# Patient Record
Sex: Male | Born: 1970 | Race: White | Hispanic: No | Marital: Single | Smoking: Current every day smoker
Health system: Southern US, Community
[De-identification: ages and names within clinical notes are randomized; demographics above are authoritative.]

## PROBLEM LIST (undated history)

## (undated) DIAGNOSIS — K7460 Unspecified cirrhosis of liver: Secondary | ICD-10-CM

## (undated) DIAGNOSIS — R160 Hepatomegaly, not elsewhere classified: Secondary | ICD-10-CM

## (undated) DIAGNOSIS — S4990XA Unspecified injury of shoulder and upper arm, unspecified arm, initial encounter: Secondary | ICD-10-CM

## (undated) DIAGNOSIS — F1020 Alcohol dependence, uncomplicated: Secondary | ICD-10-CM

## (undated) HISTORY — PX: WRIST SURGERY: SHX841

## (undated) HISTORY — PX: SHOULDER SURGERY: SHX246

---

## 2014-07-07 ENCOUNTER — Observation Stay (HOSPITAL_COMMUNITY)
Admission: EM | Admit: 2014-07-07 | Discharge: 2014-07-07 | Disposition: A | Discharge status: Home / Self Care | Payer: Medicaid Other | Attending: Internal Medicine | Admitting: Internal Medicine

## 2014-07-07 ENCOUNTER — Encounter (HOSPITAL_COMMUNITY): Payer: Self-pay | Admitting: Emergency Medicine

## 2014-07-07 ENCOUNTER — Emergency Department (HOSPITAL_COMMUNITY): Payer: Self-pay

## 2014-07-07 DIAGNOSIS — R4020 Unspecified coma: Secondary | ICD-10-CM | POA: Insufficient documentation

## 2014-07-07 DIAGNOSIS — R55 Syncope and collapse: Secondary | ICD-10-CM | POA: Diagnosis not present

## 2014-07-07 DIAGNOSIS — Z720 Tobacco use: Secondary | ICD-10-CM | POA: Diagnosis not present

## 2014-07-07 DIAGNOSIS — R0789 Other chest pain: Principal | ICD-10-CM | POA: Insufficient documentation

## 2014-07-07 DIAGNOSIS — R072 Precordial pain: Secondary | ICD-10-CM | POA: Diagnosis not present

## 2014-07-07 DIAGNOSIS — R404 Transient alteration of awareness: Secondary | ICD-10-CM

## 2014-07-07 DIAGNOSIS — F1010 Alcohol abuse, uncomplicated: Secondary | ICD-10-CM | POA: Diagnosis not present

## 2014-07-07 DIAGNOSIS — R079 Chest pain, unspecified: Secondary | ICD-10-CM | POA: Diagnosis not present

## 2014-07-07 DIAGNOSIS — F17210 Nicotine dependence, cigarettes, uncomplicated: Secondary | ICD-10-CM | POA: Insufficient documentation

## 2014-07-07 DIAGNOSIS — Z87891 Personal history of nicotine dependence: Secondary | ICD-10-CM

## 2014-07-07 HISTORY — DX: Unspecified injury of shoulder and upper arm, unspecified arm, initial encounter: S49.90XA

## 2014-07-07 LAB — CBG MONITORING, ED: GLUCOSE-CAPILLARY: 106 mg/dL — AB (ref 70–99)

## 2014-07-07 LAB — RAPID URINE DRUG SCREEN, HOSP PERFORMED
Amphetamines: NOT DETECTED
Barbiturates: NOT DETECTED
Benzodiazepines: POSITIVE — AB
Cocaine: NOT DETECTED
Opiates: NOT DETECTED
TETRAHYDROCANNABINOL: POSITIVE — AB

## 2014-07-07 LAB — BASIC METABOLIC PANEL
ANION GAP: 7 (ref 5–15)
BUN: 6 mg/dL (ref 6–23)
CO2: 26 mmol/L (ref 19–32)
CREATININE: 0.58 mg/dL (ref 0.50–1.35)
Calcium: 7.7 mg/dL — ABNORMAL LOW (ref 8.4–10.5)
Chloride: 106 mmol/L (ref 96–112)
GFR calc non Af Amer: >90 mL/min (ref >90)
GLUCOSE: 103 mg/dL — AB (ref 70–99)
Potassium: 3.5 mmol/L (ref 3.5–5.1)
Sodium: 139 mmol/L (ref 135–145)

## 2014-07-07 LAB — CBC WITH DIFFERENTIAL/PLATELET
BASOS ABS: 0 10*3/uL (ref 0.0–0.1)
BASOS PCT: 1 % (ref 0–1)
Eosinophils Absolute: 0.2 10*3/uL (ref 0.0–0.7)
Eosinophils Relative: 6 % — ABNORMAL HIGH (ref 0–5)
HCT: 36.9 % — ABNORMAL LOW (ref 39.0–52.0)
Hemoglobin: 13 g/dL (ref 13.0–17.0)
Lymphocytes Relative: 31 % (ref 12–46)
Lymphs Abs: 1.2 10*3/uL (ref 0.7–4.0)
MCH: 32.8 pg (ref 26.0–34.0)
MCHC: 35.2 g/dL (ref 30.0–36.0)
MCV: 93.2 fL (ref 78.0–100.0)
MONOS PCT: 13 % — AB (ref 3–12)
Monocytes Absolute: 0.5 10*3/uL (ref 0.1–1.0)
NEUTROS PCT: 49 % (ref 43–77)
Neutro Abs: 1.9 10*3/uL (ref 1.7–7.7)
Platelets: 103 10*3/uL — ABNORMAL LOW (ref 150–400)
RBC: 3.96 MIL/uL — ABNORMAL LOW (ref 4.22–5.81)
RDW: 14.1 % (ref 11.5–15.5)
WBC: 3.9 10*3/uL — ABNORMAL LOW (ref 4.0–10.5)

## 2014-07-07 LAB — TSH: TSH: 0.754 u[IU]/mL (ref 0.350–4.500)

## 2014-07-07 LAB — I-STAT TROPONIN, ED: TROPONIN I, POC: 0 ng/mL (ref 0.00–0.08)

## 2014-07-07 LAB — TROPONIN I

## 2014-07-07 MED ORDER — LORAZEPAM 2 MG/ML IJ SOLN
1.0000 mg | Freq: Once | INTRAMUSCULAR | Status: AC
  Administered 2014-07-07: 1 mg via INTRAVENOUS
  Filled 2014-07-07: qty 1

## 2014-07-07 MED ORDER — CHLORDIAZEPOXIDE HCL 5 MG PO CAPS: 10.0000 mg | ORAL_CAPSULE | Freq: Three times a day (TID) | ORAL | Status: DC

## 2014-07-07 MED ORDER — SODIUM CHLORIDE 0.9 % IV BOLUS (SEPSIS)
1000.0000 mL | Freq: Once | INTRAVENOUS | Status: AC
Start: 2014-07-07 — End: 2014-07-07
  Administered 2014-07-07: 1000 mL via INTRAVENOUS

## 2014-07-07 MED ORDER — ASPIRIN 81 MG PO CHEW: 81.0000 mg | CHEWABLE_TABLET | Freq: Every day | ORAL | Status: DC

## 2014-07-07 NOTE — ED Notes (Signed)
Report to gabe, rn.  Pt care transferred 

## 2014-07-07 NOTE — ED Notes (Signed)
Notified RN of CBG 106

## 2014-07-07 NOTE — ED Notes (Signed)
Pt here from work were he had a syncopal episode , EMT that works with pt could not feel a pulse and started compressions , pt was also foaming at mouth , upon ems arrival pt was awake but confused

## 2014-07-07 NOTE — Discharge Instructions (Signed)
You are advised not to drive till you are cleared by the cardiologist to do do

## 2014-07-07 NOTE — ED Provider Notes (Signed)
CSN: [id number]     Arrival date & time 07/07/14  1052 History   First MD Initiated Contact with Patient 07/07/14 1052     Chief Complaint  Patient presents with  . Chest Pain  . Loss of Consciousness     (Consider location/radiation/quality/duration/timing/severity/associated sxs/prior Treatment) HPI   This is a 44 year old male with no significant past medical history who comes in after having probable syncopal episode. Patient was at work so having chest pain that lasted for a few seconds was substernal pressure-like. He started feeling poorly, walked outside and started feeling lightheaded hot, had a syncopal episode. Bystanders saw him and evaluated him, they cannot feel a pulse, so started chest compressions. They didn't approximately 5-10 chest compressions, and then he woke up. EMS got there and his vital signs were unremarkable is a little bit confused, but able to speak with them.  On arrival here he is awake alert able to offer history,. He says that his mother is currently dying, and has been very upset about this. He feels that this is caused him to have these symptoms. He currently denies chest pain or shortness of breath.  Past Medical History  Diagnosis Date  . Shoulder injury    History reviewed. No pertinent past surgical history. History reviewed. No pertinent family history. History  Substance Use Topics  . Smoking status: Current Every Day Smoker  . Smokeless tobacco: Not on file  . Alcohol Use: 3.6 oz/week    6 Cans of beer per week    Review of Systems  Constitutional: Negative for fever and chills.  Eyes: Negative for redness.  Respiratory: Positive for shortness of breath. Negative for cough.   Cardiovascular: Positive for chest pain.  Gastrointestinal: Negative for nausea, vomiting, abdominal pain and diarrhea.  Genitourinary: Negative for dysuria.  Skin: Negative for rash.  Neurological: Positive for syncope. Negative for headaches.  All other  systems reviewed and are negative.     Allergies  Review of patient's allergies indicates no known allergies.  Home Medications   Prior to Admission medications   Not on File   BP 160/89 mmHg  Pulse 76  Temp(Src) 98 F (36.7 C) (Oral)  Resp 14  SpO2 98% Physical Exam  Constitutional: He is oriented to person, place, and time. No distress.  HENT:  Head: Normocephalic and atraumatic.  Eyes: EOM are normal. Pupils are equal, round, and reactive to light.  Neck: Normal range of motion. Neck supple.  Cardiovascular: Normal rate.   Pulmonary/Chest: Effort normal. No respiratory distress.  Abdominal: Soft. There is no tenderness.  Musculoskeletal: Normal range of motion.  Neurological: He is alert and oriented to person, place, and time.  Skin: No rash noted. He is not diaphoretic.  Psychiatric: He has a normal mood and affect.   Cranial Nerves  II:  Visual fields Intact, pupillary equal round and reactive to light III, IV, VI: Full eye movement without nystagmus  V Facial Sensation: Normal. No weakness of masticatory muscles  VII: No facial weakness or asymmetry  VIII Auditory Acuity: Grossly normal  IX/X: The uvula is midline; the palate elevates symmetrically  XI: Normal sternocleidomastoid and trapezius strength  XII: The tongue is midline. No atrophy or fasciculations.  Motor System: Muscle Strength: 5/5 and symmetric in the upper and lower extremities. No pronation or drift.  Muscle Tone: Tone and muscle bulk are normal in the upper and lower extremities.  Reflexes: DTRs: 2+ and symmetrical in all four extremities.  Coordination: Intact finger-to-nose,  heel-to-shin, and rapid alternating movements. No tremor.  Sensation: Intact to light touch.      ED Course  Procedures (including critical care time) Labs Review Labs Reviewed  CBC WITH DIFFERENTIAL/PLATELET  BASIC METABOLIC PANEL  CBG MONITORING, ED  I-STAT TROPOININ, ED    Imaging Review No results  found.   EKG Interpretation   Date/Time:  Thursday July 07 2014 10:58:05 EDT Ventricular Rate:  87 PR Interval:  167 QRS Duration: 97 QT Interval:  380 QTC Calculation: 457 R Axis:   96 Text Interpretation:  Sinus rhythm Borderline right axis deviation No  previous tracing Confirmed by BEATON  MD, ROBERT ([id number]) on 07/07/2014  11:02:26 AM      MDM   Final diagnoses:  None    This is a 44 year old male with no significant past medical history who comes in after having probable syncopal episode with concern for possible cardiac arrest, s/p chest compressions.  Exam as above WNL. VSS.  Feel this was most likely a syncopal episode given the prodromal sx and no hx of cardiac disease or comorbid conditions making him low risk for cardiac cause of syncope, however given the hx of possible arrest, some concern for possible arrhythmia.  The cardiac arrest was atypical in that it was very short with bystanders only given approx 5 compressions prior to ROSC.  EKG w/o abnormalities.  Will obtain, cbc/bmp/istat trop and admit for arrhythmia obs.  Labs unremarkable, trop neg.  Spoke with Rosann Auerbachrish cards who will consult in the ED and patient will be admitted to hosp service.  He has been stable in the ED.   Silas FloodErik Eliaz Fout, MD 07/07/14 1703  Nelva Nayobert Beaton, MD 07/16/14 [phone]

## 2014-07-07 NOTE — Discharge Summary (Signed)
                                                         AMA  Patient at this time expresses desire to leave the Hospital immidiately, patient has been warned that this is not Medically advisable at this time, and can result in Medical complications like Death and Disability, patient understands and accepts the risks involved and assumes full responsibilty of this decision. Told not to drive till cleared by Cards.   Blake Lambert,PRASHANT K M.D on 07/07/2014 at 6:28 PM  [facility]    Last Note Below

## 2014-07-07 NOTE — Consult Note (Signed)
CARDIOLOGY CONSULT NOTE   Patient ID: Blake Lambert MRN: [id number], DOB/AGE: February 06, 1971   Admit date: 07/07/2014 Date of Consult: 07/07/2014   Primary Physician: No PCP per Pt Primary Cardiologist: New  Pt. Profile  44 year old Caucasian male with no significant past medical history and no cardiac history presented with syncope and chest pain  Problem List  Past Medical History  Diagnosis Date  . Shoulder injury     Past Surgical History  Procedure Laterality Date  . Shoulder surgery      L shoulder     Allergies  No Known Allergies  HPI   The patient is a 44 year old Caucasian male with no significant past medical history and no cardiac history. Although patient does not have a PCP and has been several years since he was last seen by health provider. Not taking any medication at home. He denies any prior stress test or cardiac workup. He has been under a lot of pressure for the past month, as his mother was diagnosed with cancer. He does endorse occasional chest discomfort in the past month which he described is related to anxiety. He states the chest discomfort is not usually related to exertion but more so with the degree of anxiety he has for his mother's illness.   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 His work area was very hot, he started to feel dizzy and have diaphoresis. He went outside to the [place] to rest, however subsequently lost consciousness. He was found by bystanders/coworker, they did not appreciate a pulse, CPR was started. After only 5-10 compression, he woke up. [facility]. Significant laboratory finding include negative troponin 2, white blood cell count 3.9, hemoglobin 13, platelet 103. TSH normal. Chest x-ray is normal. EKG showed normal sinus rhythm, minimal ST changes in the inferior lead, no significant T-wave  changes. Patient was admitted to internal medicine service, cardiology has been consulted for syncope.  Inpatient Medications  . aspirin  81 mg Oral Daily  . chlordiazePOXIDE  10 mg Oral TID    Family History Family History  Problem Relation Age of Onset  . Cancer Mother     unkown type of CA     Social History History   Social History  . Marital Status: Single    Spouse Name: N/A  . Number of Children: N/A  . Years of Education: N/A   Occupational History  . Not on file.   Social History Main Topics  . Smoking status: Current Every Day Smoker  . Smokeless tobacco: Not on file     Comment: 1/2 ppd  . Alcohol Use: 3.6 oz/week    6 Cans of beer per week     Comment: 5-6 beers per night  . Drug Use: Yes     Comment: occasional marijuana  . Sexual Activity: Not on file   Other Topics Concern  . Not on file   Social History Narrative  . No narrative on file     Review of Systems  General:  No chills, fever, night sweats or weight changes.  Cardiovascular:  No dyspnea on exertion, edema, orthopnea, palpitations, paroxysmal nocturnal dyspnea. +chest pain, SOB, diaphoresis Dermatological: No rash, lesions/masses Respiratory: No cough Urologic: No hematuria, dysuria Abdominal:   No nausea, vomiting, diarrhea, bright red blood per rectum, melena, or hematemesis Neurologic:  No visual changes +wkns, changes in mental status. All other systems  reviewed and are otherwise negative except as noted above.  Physical Exam  Blood pressure 117/70, pulse 89, temperature 98 F (36.7 C), temperature source Oral, resp. rate 12, SpO2 92 %.  General: Pleasant, NAD Psych: Normal affect. Neuro: Alert and oriented X 3. Moves all extremities spontaneously. HEENT: Normal  Neck: Supple without bruits or JVD. Lungs:  Resp regular and unlabored, CTA. Heart: RRR no s3, s4, or murmurs. Chest sore after CPR. Abdomen: Soft, non-tender, non-distended, BS + x 4.  Extremities: No clubbing,  cyanosis or edema. DP/PT/Radials 2+ and equal bilaterally.  Labs   Recent Labs  07/07/14 1439  TROPONINI <0.03   Lab Results  Component Value Date   WBC 3.9* 07/07/2014   HGB 13.0 07/07/2014   HCT 36.9* 07/07/2014   MCV 93.2 07/07/2014   PLT 103* 07/07/2014     Recent Labs Lab 07/07/14 1218  NA 139  K 3.5  CL 106  CO2 26  BUN 6  CREATININE 0.58  CALCIUM 7.7*  GLUCOSE 103*   No results found for: CHOL, HDL, LDLCALC, TRIG No results found for: DDIMER  Radiology/Studies  Dg Chest Port 1 View  07/07/2014   CLINICAL DATA:  Initial evaluation for intermittent chest pain for 5 months  EXAM: PORTABLE CHEST - 1 VIEW  COMPARISON:  None.  FINDINGS: Heart size upper normal. Vascular pattern normal. Lungs clear. ORIF prior left clavicle fracture. No pleural effusion.  IMPRESSION: No active disease.   Electronically Signed   By: Esperanza Heir M.D.   On: 07/07/2014 12:09    ECG  NSR with minimal ST changes  ASSESSMENT AND PLAN  1. Syncope  - unclear etiology, maybe neurologically driven as patient has been extremely anxious regarding his mother's condition vs dehydration (urine appears to be concentrated however BUN to Cr ratio only 10) vs arrhythmia vs underlying CAD  - discussed with Dr. [place], potentially do treadmill stress echo to r/o ischemia and check EF, if negative, will consider outpatient heart monitor  - if echo has R heart strain, will need to r/o PE although suspicion low as he is fairly active  2. Chest pain  - per pt, chest pain related more to emotion in regard to his mother's recent health decline  - cardiac risk factor smoking. No known family h/o CAD, although pt does not know his father  - EKG showed minimal ST changes in inferior lead  3. Alcohol abuse 4. Tobacco abuse 5. Occasional marijuana use (last use 3 days ago)   Signed, Azalee Course, [place] 07/07/2014, 5:11 PM Patient seen and examined and history reviewed. Agree with above findings and  plan. 44 yo WM previously healthy. Chronic tobacco and Etoh abuse (7 beers/night). Recent extreme stress related to mother's cancer diagnosis. 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 States he was out for 10 minutes. He had some atypical chest pain. Ecg is normal. Exam is normal. Troponin normal x 2. I think his passing out is more related to dehydration and mental stress. Needs to abstain from Etoh and tobacco. Will arrange stress Echo in am. If normal could DC from our standpoint.   Peter [place], MDFACC 07/07/2014 5:56 PM

## 2014-07-07 NOTE — Progress Notes (Signed)
Pt informed of risk of leaving hospital per MD orders. Pt informed not to drive until syncopal episode is determined and he is at risk of death if he leads the hospital without being medically stable. Patient agreed and signed AMA papers.

## 2014-07-07 NOTE — ED Notes (Signed)
Gave co-worker/friend patient's personal belongings.

## 2014-07-07 NOTE — ED Notes (Signed)
Report attempted. Unable to take report at this time  

## 2014-07-07 NOTE — H&P (Signed)
Patient Demographics  Blake Lambert, is a 44 y.o. male  MRN: [id number]   DOB - 29-Jun-1970  Admit Date - 07/07/2014  Outpatient Primary MD for the patient is No primary care provider on file.   With History of -  Past Medical History  Diagnosis Date  . Shoulder injury       History reviewed. No pertinent past surgical history.  in for   Chief Complaint  Patient presents with  . Chest Pain  . Loss of Consciousness     HPI  Blake Lambert  is a 43 y.o. 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 There was no seizure-like activity, no stool or urinary incontinence, no tongue bite. He was not post ictal.   He was brought to the ER where his initial workup including blood work, troponin EKG and chest x-ray was unremarkable. I was called to admit the patient for chest pain along with syncope. He has been under some emotional stress due to the sickness of his mom.   Review of Systems  currently symptom-free  In addition to the HPI above  No Fever-chills, No Headache, No changes with Vision or hearing, No problems swallowing food or Liquids, No Chest pain, Cough or Shortness of Breath, No Abdominal pain, No Nausea or Vommitting, Bowel movements are regular, No Blood in stool or Urine, No dysuria, No new skin rashes or bruises, No new joints pains-aches,  No new weakness,  tingling, numbness in any extremity, No recent weight gain or loss, No polyuria, polydypsia or polyphagia, No significant Mental Stressors.  A full 10 point Review of Systems was done, except as stated above, all other Review of Systems were negative.   Social History History  Substance Use Topics  . Smoking status: Current Every Day Smoker  . Smokeless tobacco: Not on file  . Alcohol Use: 3.6 oz/week    6 Cans of beer per week      Family History No history of sudden cardiac death  Prior to Admission medications   Medication Sig Start Date End Date Taking? Authorizing Provider  Aspirin-Acetaminophen-Caffeine (GOODYS EXTRA STRENGTH PO) Take 1 Package by mouth daily as needed (pain, headache).   Yes Historical Provider, MD    No Known Allergies  Physical Exam  Vitals  Blood pressure 127/75, pulse 67, temperature 98 F (36.7 C), temperature source Oral, resp. rate 15, SpO2 97 %.   1. General middle aged male lying in bed in NAD,  2.  Falt affect   Not Suicidal or Homicidal, Awake Alert, Oriented X 3.  3. No F.N deficits, ALL C.Nerves Intact, Strength 5/5 all 4 extremities, Sensation intact all 4 extremities, Plantars down going.  4. Ears and Eyes appear Normal, Conjunctivae clear, PERRLA. Moist Oral Mucosa.  5. Supple Neck, No JVD, No cervical lymphadenopathy appriciated, No Carotid Bruits.  6. Symmetrical Chest wall movement, Good air movement bilaterally, CTAB.  7. RRR, No Gallops, Rubs or Murmurs, No Parasternal Heave.  8. Positive Bowel Sounds, Abdomen Soft, No tenderness, No organomegaly appriciated,No rebound -guarding or rigidity.  9.  No Cyanosis, Normal Skin Turgor, No Skin Rash or Bruise.  10. Good muscle tone,  joints appear normal , no effusions, Normal ROM.  11. No Palpable Lymph Nodes in Neck or Axillae     Data Review  CBC  Recent Labs Lab 07/07/14 1218  WBC 3.9*  HGB 13.0  HCT 36.9*  PLT 103*  MCV 93.2  MCH 32.8  MCHC 35.2    RDW 14.1  LYMPHSABS 1.2  MONOABS 0.5  EOSABS 0.2  BASOSABS 0.0   ------------------------------------------------------------------------------------------------------------------  Chemistries   Recent Labs Lab 07/07/14 1218  NA 139  K 3.5  CL 106  CO2 26  GLUCOSE 103*  BUN 6  CREATININE 0.58  CALCIUM 7.7*   ------------------------------------------------------------------------------------------------------------------ CrCl cannot be calculated (Unknown ideal weight.). ------------------------------------------------------------------------------------------------------------------ No results for input(s): TSH, T4TOTAL, T3FREE, THYROIDAB in the last 72 hours.  Invalid input(s): FREET3   Coagulation profile No results for input(s): INR, PROTIME in the last 168 hours. ------------------------------------------------------------------------------------------------------------------- No results for input(s): DDIMER in the last 72 hours. -------------------------------------------------------------------------------------------------------------------  Cardiac Enzymes No results for input(s): CKMB, TROPONINI, MYOGLOBIN in the last 168 hours.  Invalid input(s): CK ------------------------------------------------------------------------------------------------------------------ Invalid input(s): POCBNP   ---------------------------------------------------------------------------------------------------------------  Urinalysis No results found for: COLORURINE, APPEARANCEUR, LABSPEC, PHURINE, GLUCOSEU, HGBUR, BILIRUBINUR, KETONESUR, PROTEINUR, UROBILINOGEN, NITRITE, LEUKOCYTESUR  ----------------------------------------------------------------------------------------------------------------  Imaging results:   Dg Chest Port 1 View  07/07/2014   CLINICAL DATA:  Initial evaluation for intermittent chest pain for 5 months  EXAM: PORTABLE CHEST - 1 VIEW  COMPARISON:   None.  FINDINGS: Heart size upper normal. Vascular pattern normal. Lungs clear. ORIF prior left clavicle fracture. No pleural effusion.  IMPRESSION: No active disease.   Electronically Signed   By: Esperanza Heir M.D.   On: 07/07/2014 12:09    My personal review of EKG: Rhythm NSR, Rate  87 /min, QTc 457 ms , no Acute ST changes    Assessment & Plan   1. Syncope with Chest pain -  risk factor is cigarette smoking, no other known medical problems but he does not follow with PCP either, will be kept and 23 are observation, telemetry, cycle troponin, place on 81 of aspirin, check TSH and echogram. Request cardiology to evaluate, question need for outpatient event monitor worse is a stress test.    2. Alcohol abuse and ongoing smoking. Counseled to quit. Schedule Librium along with CIWA protocol.     DVT Prophylaxis  Lovenox   AM Labs Ordered, also please review Full Orders  Family Communication: Admission, patients condition and plan of care including tests being ordered have been discussed with the patient and family who indicate understanding and agree with the plan and Code Status.  Code Status Full  Likely DC to  Home  Condition Fair  Time spent in minutes : 35    Susa Raring K M.D on 07/07/2014 at 2:25 PM  Between 7am to 7pm - Pager -  [phone]  After 7pm go to [email or web address] - password [email or web address]  [facility]  Office  [phone]

## 2014-09-07 ENCOUNTER — Emergency Department (HOSPITAL_COMMUNITY): Payer: Medicaid Other

## 2014-09-07 ENCOUNTER — Inpatient Hospital Stay (HOSPITAL_COMMUNITY)
Admission: EM | Admit: 2014-09-07 | Discharge: 2014-09-13 | DRG: 420 | Disposition: A | Discharge status: Home / Self Care | Payer: Medicaid Other | Attending: Internal Medicine | Admitting: Internal Medicine

## 2014-09-07 ENCOUNTER — Encounter (HOSPITAL_COMMUNITY): Payer: Self-pay | Admitting: Family Medicine

## 2014-09-07 ENCOUNTER — Inpatient Hospital Stay (HOSPITAL_COMMUNITY): Payer: Medicaid Other

## 2014-09-07 DIAGNOSIS — F1010 Alcohol abuse, uncomplicated: Secondary | ICD-10-CM | POA: Diagnosis not present

## 2014-09-07 DIAGNOSIS — K7040 Alcoholic hepatic failure without coma: Secondary | ICD-10-CM | POA: Diagnosis present

## 2014-09-07 DIAGNOSIS — E871 Hypo-osmolality and hyponatremia: Secondary | ICD-10-CM | POA: Diagnosis present

## 2014-09-07 DIAGNOSIS — D6959 Other secondary thrombocytopenia: Secondary | ICD-10-CM | POA: Diagnosis present

## 2014-09-07 DIAGNOSIS — K7031 Alcoholic cirrhosis of liver with ascites: Principal | ICD-10-CM | POA: Diagnosis present

## 2014-09-07 DIAGNOSIS — R1110 Vomiting, unspecified: Secondary | ICD-10-CM

## 2014-09-07 DIAGNOSIS — R34 Anuria and oliguria: Secondary | ICD-10-CM | POA: Diagnosis present

## 2014-09-07 DIAGNOSIS — F10239 Alcohol dependence with withdrawal, unspecified: Secondary | ICD-10-CM | POA: Diagnosis present

## 2014-09-07 DIAGNOSIS — R7401 Elevation of levels of liver transaminase levels: Secondary | ICD-10-CM

## 2014-09-07 DIAGNOSIS — R188 Other ascites: Secondary | ICD-10-CM | POA: Diagnosis not present

## 2014-09-07 DIAGNOSIS — R6510 Systemic inflammatory response syndrome (SIRS) of non-infectious origin without acute organ dysfunction: Secondary | ICD-10-CM | POA: Insufficient documentation

## 2014-09-07 DIAGNOSIS — K766 Portal hypertension: Secondary | ICD-10-CM | POA: Diagnosis present

## 2014-09-07 DIAGNOSIS — R740 Nonspecific elevation of levels of transaminase and lactic acid dehydrogenase [LDH]: Secondary | ICD-10-CM | POA: Diagnosis present

## 2014-09-07 DIAGNOSIS — D696 Thrombocytopenia, unspecified: Secondary | ICD-10-CM | POA: Diagnosis not present

## 2014-09-07 DIAGNOSIS — E876 Hypokalemia: Secondary | ICD-10-CM | POA: Diagnosis present

## 2014-09-07 DIAGNOSIS — Z7982 Long term (current) use of aspirin: Secondary | ICD-10-CM | POA: Diagnosis not present

## 2014-09-07 DIAGNOSIS — F17210 Nicotine dependence, cigarettes, uncomplicated: Secondary | ICD-10-CM | POA: Diagnosis present

## 2014-09-07 DIAGNOSIS — R1011 Right upper quadrant pain: Secondary | ICD-10-CM | POA: Diagnosis not present

## 2014-09-07 DIAGNOSIS — R195 Other fecal abnormalities: Secondary | ICD-10-CM | POA: Diagnosis not present

## 2014-09-07 DIAGNOSIS — Z87891 Personal history of nicotine dependence: Secondary | ICD-10-CM

## 2014-09-07 DIAGNOSIS — R1010 Upper abdominal pain, unspecified: Secondary | ICD-10-CM

## 2014-09-07 DIAGNOSIS — G9001 Carotid sinus syncope: Secondary | ICD-10-CM

## 2014-09-07 DIAGNOSIS — R109 Unspecified abdominal pain: Secondary | ICD-10-CM

## 2014-09-07 DIAGNOSIS — F419 Anxiety disorder, unspecified: Secondary | ICD-10-CM | POA: Diagnosis present

## 2014-09-07 DIAGNOSIS — F10230 Alcohol dependence with withdrawal, uncomplicated: Secondary | ICD-10-CM | POA: Diagnosis not present

## 2014-09-07 DIAGNOSIS — R1901 Right upper quadrant abdominal swelling, mass and lump: Secondary | ICD-10-CM

## 2014-09-07 DIAGNOSIS — K852 Alcohol induced acute pancreatitis without necrosis or infection: Secondary | ICD-10-CM | POA: Insufficient documentation

## 2014-09-07 DIAGNOSIS — K7010 Alcoholic hepatitis without ascites: Secondary | ICD-10-CM | POA: Diagnosis not present

## 2014-09-07 DIAGNOSIS — R4020 Unspecified coma: Secondary | ICD-10-CM

## 2014-09-07 DIAGNOSIS — K7030 Alcoholic cirrhosis of liver without ascites: Secondary | ICD-10-CM | POA: Diagnosis not present

## 2014-09-07 DIAGNOSIS — B181 Chronic viral hepatitis B without delta-agent: Secondary | ICD-10-CM | POA: Diagnosis present

## 2014-09-07 DIAGNOSIS — R0782 Intercostal pain: Secondary | ICD-10-CM

## 2014-09-07 DIAGNOSIS — R1084 Generalized abdominal pain: Secondary | ICD-10-CM

## 2014-09-07 DIAGNOSIS — R160 Hepatomegaly, not elsewhere classified: Secondary | ICD-10-CM

## 2014-09-07 DIAGNOSIS — K7200 Acute and subacute hepatic failure without coma: Secondary | ICD-10-CM | POA: Diagnosis not present

## 2014-09-07 DIAGNOSIS — K810 Acute cholecystitis: Secondary | ICD-10-CM | POA: Diagnosis present

## 2014-09-07 DIAGNOSIS — K819 Cholecystitis, unspecified: Secondary | ICD-10-CM | POA: Diagnosis not present

## 2014-09-07 DIAGNOSIS — A419 Sepsis, unspecified organism: Secondary | ICD-10-CM | POA: Diagnosis not present

## 2014-09-07 HISTORY — DX: Hepatomegaly, not elsewhere classified: R16.0

## 2014-09-07 HISTORY — DX: Unspecified cirrhosis of liver: K74.60

## 2014-09-07 HISTORY — DX: Alcohol dependence, uncomplicated: F10.20

## 2014-09-07 LAB — HEPATIC FUNCTION PANEL
ALK PHOS: 217 U/L — AB (ref 38–126)
ALT: 103 U/L — AB (ref 17–63)
AST: 200 U/L — ABNORMAL HIGH (ref 15–41)
Albumin: 2.9 g/dL — ABNORMAL LOW (ref 3.5–5.0)
BILIRUBIN DIRECT: 1.2 mg/dL — AB (ref 0.1–0.5)
Indirect Bilirubin: 2 mg/dL — ABNORMAL HIGH (ref 0.3–0.9)
Total Bilirubin: 3.2 mg/dL — ABNORMAL HIGH (ref 0.3–1.2)
Total Protein: 7 g/dL (ref 6.5–8.1)

## 2014-09-07 LAB — URINALYSIS, ROUTINE W REFLEX MICROSCOPIC
GLUCOSE, UA: NEGATIVE mg/dL
Hgb urine dipstick: NEGATIVE
Ketones, ur: 15 mg/dL — AB
NITRITE: POSITIVE — AB
PH: 7.5 (ref 5.0–8.0)
Protein, ur: 30 mg/dL — AB
SPECIFIC GRAVITY, URINE: 1.028 (ref 1.005–1.030)
Urobilinogen, UA: 4 mg/dL — ABNORMAL HIGH (ref 0.0–1.0)

## 2014-09-07 LAB — CBC WITH DIFFERENTIAL/PLATELET
Basophils Absolute: 0 10*3/uL (ref 0.0–0.1)
Basophils Relative: 0 % (ref 0–1)
EOS PCT: 0 % (ref 0–5)
Eosinophils Absolute: 0 10*3/uL (ref 0.0–0.7)
HCT: 36.6 % — ABNORMAL LOW (ref 39.0–52.0)
Hemoglobin: 13.3 g/dL (ref 13.0–17.0)
LYMPHS ABS: 1.3 10*3/uL (ref 0.7–4.0)
LYMPHS PCT: 18 % (ref 12–46)
MCH: 32.3 pg (ref 26.0–34.0)
MCHC: 36.3 g/dL — ABNORMAL HIGH (ref 30.0–36.0)
MCV: 88.8 fL (ref 78.0–100.0)
Monocytes Absolute: 1.2 10*3/uL — ABNORMAL HIGH (ref 0.1–1.0)
Monocytes Relative: 18 % — ABNORMAL HIGH (ref 3–12)
Neutro Abs: 4.6 10*3/uL (ref 1.7–7.7)
Neutrophils Relative %: 64 % (ref 43–77)
Platelets: 91 10*3/uL — ABNORMAL LOW (ref 150–400)
RBC: 4.12 MIL/uL — ABNORMAL LOW (ref 4.22–5.81)
RDW: 14.9 % (ref 11.5–15.5)
WBC: 7.1 10*3/uL (ref 4.0–10.5)

## 2014-09-07 LAB — AMMONIA: AMMONIA: 31 umol/L (ref 9–35)

## 2014-09-07 LAB — PROTIME-INR
INR: 1.35 (ref 0.00–1.49)
Prothrombin Time: 16.8 seconds — ABNORMAL HIGH (ref 11.6–15.2)

## 2014-09-07 LAB — I-STAT CHEM 8, ED
BUN: 14 mg/dL (ref 6–20)
CALCIUM ION: 0.95 mmol/L — AB (ref 1.12–1.23)
CHLORIDE: 91 mmol/L — AB (ref 101–111)
Creatinine, Ser: 0.6 mg/dL — ABNORMAL LOW (ref 0.61–1.24)
Glucose, Bld: 137 mg/dL — ABNORMAL HIGH (ref 65–99)
HEMATOCRIT: 43 % (ref 39.0–52.0)
Hemoglobin: 14.6 g/dL (ref 13.0–17.0)
Potassium: 4.3 mmol/L (ref 3.5–5.1)
SODIUM: 132 mmol/L — AB (ref 135–145)
TCO2: 23 mmol/L (ref 0–100)

## 2014-09-07 LAB — I-STAT CG4 LACTIC ACID, ED
LACTIC ACID, VENOUS: 1.54 mmol/L (ref 0.5–2.0)
Lactic Acid, Venous: 5.34 mmol/L (ref 0.5–2.0)

## 2014-09-07 LAB — URINE MICROSCOPIC-ADD ON

## 2014-09-07 LAB — LACTIC ACID, PLASMA
LACTIC ACID, VENOUS: 1.7 mmol/L (ref 0.5–2.0)
Lactic Acid, Venous: 1.8 mmol/L (ref 0.5–2.0)

## 2014-09-07 LAB — AMYLASE: Amylase: 139 U/L — ABNORMAL HIGH (ref 28–100)

## 2014-09-07 LAB — POC OCCULT BLOOD, ED: Fecal Occult Bld: POSITIVE — AB

## 2014-09-07 LAB — LACTATE DEHYDROGENASE: LDH: 240 U/L — AB (ref 98–192)

## 2014-09-07 LAB — ACETAMINOPHEN LEVEL

## 2014-09-07 LAB — LIPASE, BLOOD: Lipase: 182 U/L — ABNORMAL HIGH (ref 22–51)

## 2014-09-07 MED ORDER — LORAZEPAM 2 MG/ML IJ SOLN: 0.0000 mg | Freq: Two times a day (BID) | INTRAMUSCULAR | Status: AC

## 2014-09-07 MED ORDER — ONDANSETRON HCL 4 MG/2ML IJ SOLN
4.0000 mg | Freq: Once | INTRAMUSCULAR | Status: AC
  Administered 2014-09-07: 4 mg via INTRAVENOUS
  Filled 2014-09-07: qty 2

## 2014-09-07 MED ORDER — LORAZEPAM 1 MG PO TABS
0.0000 mg | ORAL_TABLET | Freq: Two times a day (BID) | ORAL | Status: DC
Start: 2014-09-07 — End: 2014-09-08

## 2014-09-07 MED ORDER — SODIUM CHLORIDE 0.9 % IV SOLN
1.0000 g | Freq: Once | INTRAVENOUS | Status: AC
  Administered 2014-09-07: 1 g via INTRAVENOUS
  Filled 2014-09-07 (×2): qty 10

## 2014-09-07 MED ORDER — IOHEXOL 300 MG/ML  SOLN
100.0000 mL | Freq: Once | INTRAMUSCULAR | Status: AC | PRN
  Administered 2014-09-07: 100 mL via INTRAVENOUS

## 2014-09-07 MED ORDER — ONDANSETRON HCL 4 MG/2ML IJ SOLN: 4.0000 mg | Freq: Once | INTRAMUSCULAR | Status: AC

## 2014-09-07 MED ORDER — MORPHINE SULFATE 2 MG/ML IJ SOLN
2.0000 mg | INTRAMUSCULAR | Status: DC | PRN
  Administered 2014-09-07 – 2014-09-08 (×4): 4 mg via INTRAVENOUS
  Administered 2014-09-11 (×3): 2 mg via INTRAVENOUS
  Administered 2014-09-11 – 2014-09-12 (×5): 4 mg via INTRAVENOUS
  Administered 2014-09-12 (×2): 2 mg via INTRAVENOUS
  Administered 2014-09-13 (×3): 4 mg via INTRAVENOUS
  Filled 2014-09-07 (×3): qty 2
  Filled 2014-09-07 (×3): qty 1
  Filled 2014-09-07 (×4): qty 2
  Filled 2014-09-07: qty 1
  Filled 2014-09-07 (×7): qty 2

## 2014-09-07 MED ORDER — ONDANSETRON HCL 4 MG/2ML IJ SOLN
4.0000 mg | Freq: Three times a day (TID) | INTRAMUSCULAR | Status: DC | PRN
  Administered 2014-09-07 – 2014-09-10 (×3): 4 mg via INTRAVENOUS
  Administered 2014-09-11: 8 mg via INTRAVENOUS
  Filled 2014-09-07 (×3): qty 2
  Filled 2014-09-07: qty 4

## 2014-09-07 MED ORDER — PANTOPRAZOLE SODIUM 40 MG IV SOLR
40.0000 mg | Freq: Once | INTRAVENOUS | Status: AC
  Administered 2014-09-07: 40 mg via INTRAVENOUS
  Filled 2014-09-07: qty 40

## 2014-09-07 MED ORDER — DEXTROSE 5 % IV SOLN
1.0000 g | INTRAVENOUS | Status: DC
  Filled 2014-09-07: qty 10

## 2014-09-07 MED ORDER — DEXTROSE-NACL 5-0.9 % IV SOLN
INTRAVENOUS | Status: DC
  Administered 2014-09-07: 21:00:00 via INTRAVENOUS
  Administered 2014-09-08: 75 mL/h via INTRAVENOUS

## 2014-09-07 MED ORDER — VITAMIN B-1 100 MG PO TABS
100.0000 mg | ORAL_TABLET | Freq: Every day | ORAL | Status: DC
  Administered 2014-09-08 – 2014-09-13 (×4): 100 mg via ORAL
  Filled 2014-09-07 (×6): qty 1

## 2014-09-07 MED ORDER — LORAZEPAM 2 MG/ML IJ SOLN
0.0000 mg | Freq: Four times a day (QID) | INTRAMUSCULAR | Status: AC
  Administered 2014-09-08: 4 mg via INTRAVENOUS
  Administered 2014-09-08: 2 mg via INTRAVENOUS
  Filled 2014-09-07: qty 1
  Filled 2014-09-07: qty 2

## 2014-09-07 MED ORDER — DEXTROSE 5 % IV SOLN
1.0000 g | Freq: Once | INTRAVENOUS | Status: AC
  Administered 2014-09-07: 1 g via INTRAVENOUS
  Filled 2014-09-07: qty 10

## 2014-09-07 MED ORDER — SODIUM CHLORIDE 0.9 % IV SOLN: 250.0000 mL | INTRAVENOUS | Status: DC | PRN

## 2014-09-07 MED ORDER — FENTANYL CITRATE (PF) 100 MCG/2ML IJ SOLN
100.0000 ug | INTRAMUSCULAR | Status: DC | PRN
  Administered 2014-09-07 (×3): 100 ug via INTRAVENOUS
  Filled 2014-09-07 (×3): qty 2

## 2014-09-07 MED ORDER — LORAZEPAM 2 MG/ML IJ SOLN
1.0000 mg | Freq: Once | INTRAMUSCULAR | Status: AC
  Administered 2014-09-07: 1 mg via INTRAVENOUS
  Filled 2014-09-07: qty 1

## 2014-09-07 MED ORDER — FENTANYL CITRATE (PF) 100 MCG/2ML IJ SOLN
100.0000 ug | Freq: Once | INTRAMUSCULAR | Status: AC
  Administered 2014-09-07: 100 ug via INTRAVENOUS
  Filled 2014-09-07: qty 2

## 2014-09-07 MED ORDER — HYDROMORPHONE HCL 1 MG/ML IJ SOLN
1.0000 mg | Freq: Once | INTRAMUSCULAR | Status: AC
  Administered 2014-09-07: 1 mg via INTRAVENOUS
  Filled 2014-09-07: qty 1

## 2014-09-07 MED ORDER — LORAZEPAM 1 MG PO TABS
0.0000 mg | ORAL_TABLET | Freq: Four times a day (QID) | ORAL | Status: DC
  Administered 2014-09-07: 1 mg via ORAL
  Filled 2014-09-07: qty 1

## 2014-09-07 MED ORDER — SODIUM CHLORIDE 0.9 % IV BOLUS (SEPSIS)
1000.0000 mL | Freq: Once | INTRAVENOUS | Status: AC
  Administered 2014-09-07: 1000 mL via INTRAVENOUS

## 2014-09-07 MED ORDER — THIAMINE HCL 100 MG/ML IJ SOLN
100.0000 mg | Freq: Every day | INTRAMUSCULAR | Status: DC
  Administered 2014-09-07 – 2014-09-13 (×6): 100 mg via INTRAVENOUS
  Filled 2014-09-07 (×4): qty 1
  Filled 2014-09-07: qty 2
  Filled 2014-09-07 (×2): qty 1

## 2014-09-07 MED ORDER — FOLIC ACID 1 MG PO TABS
1.0000 mg | ORAL_TABLET | Freq: Every day | ORAL | Status: DC
  Administered 2014-09-07 – 2014-09-13 (×6): 1 mg via ORAL
  Filled 2014-09-07 (×7): qty 1

## 2014-09-07 MED ORDER — IOHEXOL 300 MG/ML  SOLN
25.0000 mL | Freq: Once | INTRAMUSCULAR | Status: AC | PRN
  Administered 2014-09-07: 25 mL via ORAL

## 2014-09-07 NOTE — ED Notes (Signed)
Co-Worker- [place] Sizerhomas Herdnandez- Can Pick Pt up when Discharged- [phone].

## 2014-09-07 NOTE — ED Notes (Signed)
Patient refused to put on non-slip socks and refused to put on shoes to ambulate to the restroom.

## 2014-09-07 NOTE — ED Notes (Signed)
Pt vomiting. Given nausea medication.

## 2014-09-07 NOTE — Progress Notes (Signed)
eLink Physician-Brief Progress Note Patient Name: Ina HomesJesse Fischbach DOB: 1970-05-27 MRN: [id number]   Date of Service  09/07/2014  HPI/Events of Note  abd pain - fent ineffective vomiting  eICU Interventions  Change to morphine prn zofran prn     Intervention Category Intermediate Interventions: Abdominal pain - evaluation and management  ALVA,RAKESH V. 09/07/2014, 11:26 PM

## 2014-09-07 NOTE — ED Notes (Signed)
Blake Lambert (co worker)  [phone]  **let pt know this is his number**

## 2014-09-07 NOTE — Progress Notes (Signed)
Pt states his pain is so bad he feels that he's going to start convulsing.  He's moaing/groaning and moving all over in the bed.  Dilaudid was given in the ED which never eased the pain.  Fentanyl was then given, pt received 3 doses of 100 mg over the course of 5 hours, the fentanyl didn't touch the pain either.  Pt just received 4mg  or morphine, RN will continue to monitor.

## 2014-09-07 NOTE — ED Notes (Signed)
Pa made aware patient Blake Lambert advised patient meets Septic criteria

## 2014-09-07 NOTE — Progress Notes (Signed)
Pt arrived to 2C15 at 2120. Vital signs stable, 10/10 abdominal pain, RN will continue to monitor.

## 2014-09-07 NOTE — ED Provider Notes (Signed)
CSN: [id number]     Arrival date & time 09/07/14  1500 History   First MD Initiated Contact with Patient 09/07/14 1514     Chief Complaint  Patient presents with  . Abdominal Pain  . Emesis     (Consider location/radiation/quality/duration/timing/severity/associated sxs/prior Treatment) HPI    44 year old  Male  With history of alcohol abuse presents for evaluation of abdominal pain. Patient states for the past 3 weeks he has had persistent upper quadrant abdominal pain, nausea and vomiting with flulike symptoms. States he was having difficulty keeping his food down. He admits to drinking about 12 beers per day and today he developed worsening sharp stabbing pain to his upper abdomen , and state " feels like something is ruptured ". Patient is currently 10 out of 10. he also noticed black tarry stool. Vomitus is nonbloody nonbilious. He denies fever, chills ,  Runny nose sneezing coughing , chest pain, shortness of breath , productive cough, hemoptysis, hematemesis , dysuria , hematuria. Denies lightheadedness or dizziness. Admits to smoking marijuana but denies any other recreational drug use. Denies prior history of pancreatitis or hepatitis. Denies history of IV drug use.  Past Medical History  Diagnosis Date  . Shoulder injury    Past Surgical History  Procedure Laterality Date  . Shoulder surgery      L shoulder   Family History  Problem Relation Age of Onset  . Cancer Mother     unkown type of CA   History  Substance Use Topics  . Smoking status: Current Every Day Smoker  . Smokeless tobacco: Not on file     Comment: 1/2 ppd  . Alcohol Use: 3.6 oz/week    6 Cans of beer per week     Comment: 5-6 beers per night    Review of Systems  All other systems reviewed and are negative.     Allergies  Review of patient's allergies indicates no known allergies.  Home Medications   Prior to Admission medications   Medication Sig Start Date End Date Taking? Authorizing  Provider  Aspirin-Acetaminophen-Caffeine (GOODYS EXTRA STRENGTH PO) Take 1 Package by mouth daily as needed (pain, headache).    Historical Provider, MD   BP 146/69 mmHg  Pulse 96  Temp(Src) 98.5 F (36.9 C) (Oral)  Resp 16  SpO2 100% Physical Exam  Constitutional: He appears well-developed and well-nourished. He appears distressed (Patient appears Very uncomfortable, squirming around in bed.).  HENT:  Mouth/Throat: Oropharynx is clear and moist.  Eyes: Conjunctivae and EOM are normal. Pupils are equal, round, and reactive to light.  Neck: Normal range of motion. Neck supple.  Cardiovascular:  Tachycardia without murmurs or gallops  Pulmonary/Chest:  Mildly tachypneic without active wheezes, rales, rhonchi  Abdominal: Soft. Bowel sounds are normal. He exhibits no distension. There is tenderness (Diffuse abdominal tenderness without focal point tenderness. No peritoneal sign.).  Neurological: He is alert.  Skin:  Skin is flushed.  Nursing note and vitals reviewed.   ED Course  Procedures (including critical care time)  3:37 PM Pt with  Hx of alcohol abuse, here with upper abd pain, persistent vomiting and now having black stool.  Stool with normal appearance on exam, hemoccult positive.  Pt however appears very uncomfortable, tachycardic due to his discomfort.   Will obtain acute abd series to r/o free air.  Pain medication, and antinausea medication along with ativan given for comfort.  IVF given.  Will monitor closely.  Care discussed with DR. Docherty.  5:12 PM UA with nitrite positive.  Pt does have abd pain but denies dysuria.  Urine culture sent.  Will start rocephin.  Lipase is 182, concerning for pancreatitis.  Elevated Total Bili along with transaminitis.  Pt currently awaits abd/pelvis CT scan.  Pt will need to be admitted for alcohol induce pancreatitis.  Care discussed with oncoming provider who will call for admission.     Labs Review Labs Reviewed  CBC WITH  DIFFERENTIAL/PLATELET - Abnormal; Notable for the following:    RBC 4.12 (*)    HCT 36.6 (*)    MCHC 36.3 (*)    Platelets 91 (*)    Monocytes Relative 18 (*)    Monocytes Absolute 1.2 (*)    All other components within normal limits  URINALYSIS, ROUTINE W REFLEX MICROSCOPIC - Abnormal; Notable for the following:    Color, Urine RED (*)    APPearance CLOUDY (*)    Bilirubin Urine MODERATE (*)    Ketones, ur 15 (*)    Protein, ur 30 (*)    Urobilinogen, UA 4.0 (*)    Nitrite POSITIVE (*)    Leukocytes, UA SMALL (*)    All other components within normal limits  LIPASE, BLOOD - Abnormal; Notable for the following:    Lipase 182 (*)    All other components within normal limits  HEPATIC FUNCTION PANEL - Abnormal; Notable for the following:    Albumin 2.9 (*)    AST 200 (*)    ALT 103 (*)    Alkaline Phosphatase 217 (*)    Total Bilirubin 3.2 (*)    Bilirubin, Direct 1.2 (*)    Indirect Bilirubin 2.0 (*)    All other components within normal limits  I-STAT CHEM 8, ED - Abnormal; Notable for the following:    Sodium 132 (*)    Chloride 91 (*)    Creatinine, Ser 0.60 (*)    Glucose, Bld 137 (*)    Calcium, Ion 0.95 (*)    All other components within normal limits  I-STAT CG4 LACTIC ACID, ED - Abnormal; Notable for the following:    Lactic Acid, Venous 5.34 (*)    All other components within normal limits  POC OCCULT BLOOD, ED - Abnormal; Notable for the following:    Fecal Occult Bld POSITIVE (*)    All other components within normal limits  URINE CULTURE  URINE MICROSCOPIC-ADD ON    Imaging Review Dg Abd Acute W/chest  09/07/2014   CLINICAL DATA:  Flu-like symptoms for 3 weeks  EXAM: DG ABDOMEN ACUTE W/ 1V CHEST  COMPARISON:  07/07/14  FINDINGS: There is no evidence of dilated bowel loops or free intraperitoneal air. No radiopaque calculi or other significant radiographic abnormality is seen. Heart size and mediastinal contours are within normal limits. Both lungs are clear.  Postsurgical changes are noted in the left clavicle  IMPRESSION: No acute abnormality noted.   Electronically Signed   By: Alcide CleverMark  Lukens M.D.   On: 09/07/2014 16:23     EKG Interpretation None      MDM   Final diagnoses:  Upper abdominal pain  Pancreatitis, alcoholic, acute  Intractable vomiting with nausea, vomiting of unspecified type  Transaminitis    BP 131/84 mmHg  Pulse 96  Temp(Src) 98.5 F (36.9 C) (Oral)  Resp 15  SpO2 100%     Fayrene HelperBowie Thang Flett, PA-C 09/07/14 1715  Eber HongBrian Miller, MD 09/07/14 1758  Eber HongBrian Miller, MD 09/07/14 Ernestina [id number]

## 2014-09-07 NOTE — ED Notes (Signed)
[facility] 5[facility]

## 2014-09-07 NOTE — H&P (Signed)
PULMONARY / CRITICAL CARE MEDICINE   Name: Blake HomesJesse Shoun MRN: [id number] DOB: 1971/01/20    ADMISSION DATE:  09/07/2014 CONSULTATION DATE:  09/07/2014  REFERRING MD :  Antonieta LovelessEDP Miller  CHIEF COMPLAINT:  Abdominal pain  INITIAL PRESENTATION: 44 [facility] complaining of severe abdominal pain. CT abdomen in ED showed severe cirrhosis with hepatic mass. Lactic elevated at 5. He abuses alcohol, but has not had any for 2 days. PCCM to admit.  STUDIES:  CT abd/pelvis 5/18 > Severe hepatic cirrhosis with collateral veins suggesting portal hypertension. Large area of abnormal irregular density is seen involving the superior portion of the right hepatic lobe concerning for possible neoplasm such as hepatocellular carcinoma.  RUQ US 5/18 >   SIGNIFICANT EVENTS: 5/18 - admit   HISTORY OF PRESENT ILLNESS:  44 year old male with history of EtOH abuse [facility] c/o RUQ pain. He reports flu like symptoms for 3 weeks including N/V and myalgias. This progressively worsened until the point that he was unable to keep down solids. 5/18 he developed acute onset 10/10 abdominal pain which caused him to present to ED. He reports that he drinks 12-15 cans of beer per day, and smokes 3/4 pack of cigarettes daily for at least 20 years.  Abdominal pain is localized to the RUQ. In ED he was given dilaudid for pain, which was refractory to several doses of this. He was sent for CT abdomen which showed severe cirrhosis with collateral veins suggesting portal hypertension, and large density concerning for hepatocellular carcinoma.  Upon my evaluation in ED he still complains of 10/10 RUQ pain with very short relief from IV narcotics. He also reports that he has had black colored stools since this AM, no hx of any hematochezia or hematemesis.  PAST MEDICAL HISTORY :   has a past medical history of Shoulder injury.  has past surgical history that includes Shoulder surgery. Prior to  Admission medications   Medication Sig Start Date End Date Taking? Authorizing Provider  Aspirin-Acetaminophen-Caffeine (GOODYS EXTRA STRENGTH PO) Take 1 Package by mouth daily as needed (pain, headache).   Yes Historical Provider, MD   No Known Allergies  FAMILY HISTORY:  has no family status information on file.  SOCIAL HISTORY:  reports that he has been smoking.  He does not have any smokeless tobacco history on file. He reports that he drinks about 3.6 oz of alcohol per week. He reports that he uses illicit drugs.  REVIEW OF SYSTEMS:   Bolds are positive  Constitutional: weight loss, gain, night sweats, Fevers, chills, fatigue .  HEENT: headaches, Sore throat, sneezing, nasal congestion, post nasal drip, Difficulty swallowing, Tooth/dental problems, visual complaints visual changes, ear ache CV:  chest pain, radiates: ,Orthopnea, PND, swelling in lower extremities, dizziness, palpitations, syncope.  GI  heartburn, indigestion, abdominal pain- RUQ, nausea, vomiting, diarrhea, change in bowel habits, loss of appetite, dark black stools.  Resp: cough, productive: , hemoptysis, dyspnea, chest pain, pleuritic.  Skin: rash or itching or icterus GU: dysuria, change in color of urine, urgency or frequency. flank pain, hematuria  MS: joint pain or swelling. decreased range of motion  Psych: change in mood or affect. depression or anxiety.  Neuro: difficulty with speech, weakness, numbness, ataxia    SUBJECTIVE:   VITAL SIGNS: Temp:  [98.5 F (36.9 C)] 98.5 F (36.9 C) (05/18 1502) Pulse Rate:  [93-132] 123 (05/18 1753) Resp:  [15-23] 17 (05/18 1745) BP: (119-149)/(66-85) 131/69 mmHg (  05/18 1753) SpO2:  [95 %-100 %] 100 % (05/18 1745) HEMODYNAMICS:   VENTILATOR SETTINGS:   INTAKE / OUTPUT: No intake or output data in the 24 hours ending 09/07/14 1946  PHYSICAL EXAMINATION: General:  Male of normal body habitus in acute distress from abdominal pain. Unable to sit still. Neuro:   Alert, oriented, non-focal HEENT:  [place]/AT, no JVD noted, PERRL Cardiovascular:  RRR, no MRG. No JVD noted Lungs:  Clear bilateral breath sounds Abdomen:  BS present, + hepatomegaly.  Abd soft, ND.  Tenderness in RUQ with guarding. Musculoskeletal:  No acute deformity or ROM limitation Skin:  Grossly intact  LABS:  CBC  Recent Labs Lab 09/07/14 1547 09/07/14 1555  WBC 7.1  --   HGB 13.3 14.6  HCT 36.6* 43.0  PLT 91*  --    Coag's  Recent Labs Lab 09/07/14 1801  INR 1.35   BMET  Recent Labs Lab 09/07/14 1555  NA 132*  K 4.3  CL 91*  BUN 14  CREATININE 0.60*  GLUCOSE 137*   Electrolytes No results for input(s): CALCIUM, MG, PHOS in the last 168 hours. Sepsis Markers  Recent Labs Lab 09/07/14 1555 09/07/14 1826  LATICACIDVEN 5.34* 1.8   ABG No results for input(s): PHART, PCO2ART, PO2ART in the last 168 hours. Liver Enzymes  Recent Labs Lab 09/07/14 1547  AST 200*  ALT 103*  ALKPHOS 217*  BILITOT 3.2*  ALBUMIN 2.9*   Cardiac Enzymes No results for input(s): TROPONINI, PROBNP in the last 168 hours. Glucose No results for input(s): GLUCAP in the last 168 hours.  Imaging No results found.   ASSESSMENT / PLAN:  GASTROINTESTINAL A:   Acute abdominal pain - likely multifactorial with probable ETOH pancreatitis, ? cholecystitis / cholelithiasis, ? Budd Chiari syndrome, ? PUD Alcoholic cirrhosis with portal HTN ? GI bleed - Hgb stable but pt reports dark black stools ? Hepatocellular carcinoma P:   NPO. GI consulted (Dr. Rhea BeltonPyrtle). RUQ US with doppler for further evaluation GB / portal vein disease. Trend LFT's. Repeat lipase / amylase in AM. Consider MRI abdomen for further evaluation of ? Neoplasm. May need CT guided liver bx for definitive diagnosis.  NEUROLOGIC A:   Pain management EtOH abuse - at risk for withdrawal P:   Fentanly PRN. CIWA protocol. Thiamine / Folate / Multivitamin.  PULMONARY A: Tobacco use disorder P:    Tobacco cessation counseling.  CARDIOVASCULAR A:  No acute issues P:  Monitor hemodynamics.  RENAL A:   Mild Hyponatremia - likely from decreased PO + chronic ETOH use Hypocalcemia P:   D5 NS @ 75. 1g Ca gluconate. CMP in AM.  HEMATOLOGIC A:   Thrombocytopenia - due to ETOH VTE prophylaxis P:  Monitor platelets. SCDs only. CBC in AM.  INFECTIOUS A:  UTI P:   UCx 5/18 > Abx: Ceftriaxone, start date 5/18, day 1/x.  ENDOCRINE A:   No acute issues P:   No interventions required.   FAMILY  - Updates:  None.  - Inter-disciplinary family meet or Palliative Care meeting due by:  09/13/14.   Rutherford Guysahul Desai, [place] Sidonie Dickens- [address] Pulmonary [facility] Critical Care Medicine Pager: [phone]  or [phone] 09/07/2014, 9:03 PM    Billy Fischeravid Simonds, MD ; [facility] service Mobile [phone].  After 5:30 PM or weekends, call [phone]

## 2014-09-07 NOTE — ED Notes (Signed)
Patient sent off the floor by another staff member to CT.

## 2014-09-07 NOTE — ED Notes (Addendum)
Pt here for flu like symptoms x 3 weeks and vomiting. sts now he is having RUQ pain. sts he feels like something has ruptured in his stomach from vomiting.

## 2014-09-08 ENCOUNTER — Inpatient Hospital Stay (HOSPITAL_COMMUNITY): Payer: Medicaid Other

## 2014-09-08 ENCOUNTER — Encounter (HOSPITAL_COMMUNITY): Payer: Self-pay | Admitting: Physician Assistant

## 2014-09-08 DIAGNOSIS — R160 Hepatomegaly, not elsewhere classified: Secondary | ICD-10-CM

## 2014-09-08 DIAGNOSIS — D696 Thrombocytopenia, unspecified: Secondary | ICD-10-CM

## 2014-09-08 DIAGNOSIS — K7010 Alcoholic hepatitis without ascites: Secondary | ICD-10-CM

## 2014-09-08 DIAGNOSIS — K7030 Alcoholic cirrhosis of liver without ascites: Secondary | ICD-10-CM

## 2014-09-08 DIAGNOSIS — F10230 Alcohol dependence with withdrawal, uncomplicated: Secondary | ICD-10-CM

## 2014-09-08 DIAGNOSIS — R195 Other fecal abnormalities: Secondary | ICD-10-CM

## 2014-09-08 LAB — CBC
HCT: 34.8 % — ABNORMAL LOW (ref 39.0–52.0)
HEMATOCRIT: 36.4 % — AB (ref 39.0–52.0)
Hemoglobin: 13.1 g/dL (ref 13.0–17.0)
Hemoglobin: 12.4 g/dL — ABNORMAL LOW (ref 13.0–17.0)
MCH: 32.3 pg (ref 26.0–34.0)
MCH: 31.9 pg (ref 26.0–34.0)
MCHC: 36 g/dL (ref 30.0–36.0)
MCHC: 35.6 g/dL (ref 30.0–36.0)
MCV: 89.7 fL (ref 78.0–100.0)
MCV: 89.5 fL (ref 78.0–100.0)
PLATELETS: 43 10*3/uL — AB (ref 150–400)
Platelets: 40 10*3/uL — ABNORMAL LOW (ref 150–400)
RBC: 4.06 MIL/uL — ABNORMAL LOW (ref 4.22–5.81)
RBC: 3.89 MIL/uL — ABNORMAL LOW (ref 4.22–5.81)
RDW: 14.9 % (ref 11.5–15.5)
RDW: 15.1 % (ref 11.5–15.5)
WBC: 6.2 10*3/uL (ref 4.0–10.5)
WBC: 6.5 10*3/uL (ref 4.0–10.5)

## 2014-09-08 LAB — GAMMA GT: GGT: 467 U/L — ABNORMAL HIGH (ref 7–50)

## 2014-09-08 LAB — COMPREHENSIVE METABOLIC PANEL
ALK PHOS: 211 U/L — AB (ref 38–126)
ALT: 98 U/L — AB (ref 17–63)
AST: 191 U/L — AB (ref 15–41)
Albumin: 2.7 g/dL — ABNORMAL LOW (ref 3.5–5.0)
Anion gap: 10 (ref 5–15)
BUN: 7 mg/dL (ref 6–20)
CALCIUM: 7.5 mg/dL — AB (ref 8.9–10.3)
CHLORIDE: 96 mmol/L — AB (ref 101–111)
CO2: 26 mmol/L (ref 22–32)
Creatinine, Ser: 0.55 mg/dL — ABNORMAL LOW (ref 0.61–1.24)
GFR calc Af Amer: >60 mL/min (ref >60)
GLUCOSE: 108 mg/dL — AB (ref 65–99)
POTASSIUM: 3.1 mmol/L — AB (ref 3.5–5.1)
SODIUM: 132 mmol/L — AB (ref 135–145)
Total Bilirubin: 2.8 mg/dL — ABNORMAL HIGH (ref 0.3–1.2)
Total Protein: 7.1 g/dL (ref 6.5–8.1)

## 2014-09-08 LAB — MRSA PCR SCREENING: MRSA by PCR: NEGATIVE

## 2014-09-08 LAB — TYPE AND SCREEN
ABO/RH(D): O POS
Antibody Screen: POSITIVE
DAT, IGG: NEGATIVE

## 2014-09-08 LAB — LIPASE, BLOOD: Lipase: 123 U/L — ABNORMAL HIGH (ref 22–51)

## 2014-09-08 LAB — MAGNESIUM: Magnesium: 1 mg/dL — ABNORMAL LOW (ref 1.7–2.4)

## 2014-09-08 LAB — PHOSPHORUS: Phosphorus: 2.7 mg/dL (ref 2.5–4.6)

## 2014-09-08 LAB — AMYLASE: AMYLASE: 119 U/L — AB (ref 28–100)

## 2014-09-08 LAB — HIV ANTIBODY (ROUTINE TESTING W REFLEX): HIV SCREEN 4TH GENERATION: NONREACTIVE

## 2014-09-08 MED ORDER — GADOBENATE DIMEGLUMINE 529 MG/ML IV SOLN
15.0000 mL | Freq: Once | INTRAVENOUS | Status: AC | PRN
Start: 2014-09-08 — End: 2014-09-08
  Administered 2014-09-08: 15 mL via INTRAVENOUS

## 2014-09-08 MED ORDER — ACETAMINOPHEN 325 MG PO TABS
650.0000 mg | ORAL_TABLET | Freq: Once | ORAL | Status: AC
  Administered 2014-09-08: 650 mg via ORAL
  Filled 2014-09-08: qty 2

## 2014-09-08 MED ORDER — PANTOPRAZOLE SODIUM 40 MG IV SOLR
40.0000 mg | Freq: Two times a day (BID) | INTRAVENOUS | Status: DC
  Administered 2014-09-08 – 2014-09-09 (×3): 40 mg via INTRAVENOUS
  Filled 2014-09-08 (×4): qty 40

## 2014-09-08 MED ORDER — KCL IN DEXTROSE-NACL 40-5-0.9 MEQ/L-%-% IV SOLN
INTRAVENOUS | Status: DC
  Administered 2014-09-08: 75 mL/h via INTRAVENOUS
  Administered 2014-09-09 – 2014-09-12 (×5): via INTRAVENOUS
  Filled 2014-09-08 (×13): qty 1000

## 2014-09-08 NOTE — Progress Notes (Signed)
Pt has been vomiting throughout the night, emesis is basically water and small amounts. 4 mg of Zofran was given to pt at 2342, effects were very short lived and pt was vomiting again at 0100.
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
## 2014-09-08 NOTE — Progress Notes (Signed)
Transfer report received from [facility] at 1130 and pt arrived to the unit via bed with belongings to the side at 1325. Pt drowsy and sleeping but arousable to touch. VSS except pt had a temp of 100.8 which prior RN stated prn tylenol adm prior to transferring pt; pt skin hot to touch; no pressure ulcer noted; skin intact; Telemetry applied and verified with CCMD; IV intact and infusing. Call light within reach; pt sleeping comfortably in bed. Will closely monitor. Jb Dulworth RN

## 2014-09-08 NOTE — Progress Notes (Signed)
Abd pain improved Tremulous but oriented X 3 No new complaints  Filed Vitals:   09/08/14 0400 09/08/14 0500 09/08/14 0600 09/08/14 0713  BP: 141/81 146/89 122/60 130/88  Pulse: 66 68 64 73  Temp:    99.8 F (37.7 C)  TempSrc:    Oral  Resp: 23 21 22 17   Height:      Weight:  73.75 kg (162 lb 9.4 oz)    SpO2: 98% 95% 96% 99%   Tremulous, no respiratory distress HEENT WNL - no sclericterus evident No JVD Multiple telangectasias on chest BS clear without wheezes RRR s M Abd mildly firm, NT, BS present Ext warm without edema   I have reviewed all of today's lab results. Relevant abnormalities are discussed in the A/P section   IMPRESSION:  Current alcohol abuse (12-15 beers per day) Early EtOH withdrawal Abd pain - likely due to hepatic mass  Mildly elevated pancreatic enzymes Large hepatic mass by CT abd and RUQ US Mild hyponatremia Hypokalemia Heme + stools Doubt UTI - received ceftriaxone X 1. DC 5/19 Thrombocytopenia  PLAN: Cont CIWA protocol GI service consultation 5/19 IVFs adjusted DC ceftriaxone Monitor CBC Monitor LFTs, lipase Transfer to telemetry TRH to assume 5/20 and PCCM off. Discussed with Dr Chanda BusingWoods  Sakiyah Shur, MD ; [facility] service Mobile [phone].  After 5:30 PM or weekends, call [phone]

## 2014-09-09 ENCOUNTER — Inpatient Hospital Stay (HOSPITAL_COMMUNITY): Payer: Medicaid Other

## 2014-09-09 ENCOUNTER — Encounter (HOSPITAL_COMMUNITY): Payer: Self-pay | Admitting: General Surgery

## 2014-09-09 DIAGNOSIS — F1010 Alcohol abuse, uncomplicated: Secondary | ICD-10-CM

## 2014-09-09 DIAGNOSIS — R6510 Systemic inflammatory response syndrome (SIRS) of non-infectious origin without acute organ dysfunction: Secondary | ICD-10-CM | POA: Insufficient documentation

## 2014-09-09 DIAGNOSIS — K852 Alcohol induced acute pancreatitis without necrosis or infection: Secondary | ICD-10-CM | POA: Insufficient documentation

## 2014-09-09 DIAGNOSIS — R7401 Elevation of levels of liver transaminase levels: Secondary | ICD-10-CM | POA: Insufficient documentation

## 2014-09-09 DIAGNOSIS — K7030 Alcoholic cirrhosis of liver without ascites: Secondary | ICD-10-CM | POA: Insufficient documentation

## 2014-09-09 DIAGNOSIS — A419 Sepsis, unspecified organism: Secondary | ICD-10-CM

## 2014-09-09 DIAGNOSIS — R1011 Right upper quadrant pain: Secondary | ICD-10-CM

## 2014-09-09 DIAGNOSIS — K819 Cholecystitis, unspecified: Secondary | ICD-10-CM | POA: Insufficient documentation

## 2014-09-09 DIAGNOSIS — R740 Nonspecific elevation of levels of transaminase and lactic acid dehydrogenase [LDH]: Secondary | ICD-10-CM

## 2014-09-09 DIAGNOSIS — K810 Acute cholecystitis: Secondary | ICD-10-CM

## 2014-09-09 LAB — BASIC METABOLIC PANEL
ANION GAP: 5 (ref 5–15)
BUN: 11 mg/dL (ref 6–20)
CALCIUM: 7.3 mg/dL — AB (ref 8.9–10.3)
CHLORIDE: 103 mmol/L (ref 101–111)
CO2: 27 mmol/L (ref 22–32)
CREATININE: 0.69 mg/dL (ref 0.61–1.24)
GFR calc Af Amer: >60 mL/min (ref >60)
Glucose, Bld: 112 mg/dL — ABNORMAL HIGH (ref 65–99)
Potassium: 4.5 mmol/L (ref 3.5–5.1)
Sodium: 135 mmol/L (ref 135–145)

## 2014-09-09 LAB — URINE CULTURE

## 2014-09-09 LAB — CBC
HCT: 35.3 % — ABNORMAL LOW (ref 39.0–52.0)
Hemoglobin: 12.7 g/dL — ABNORMAL LOW (ref 13.0–17.0)
MCH: 32.5 pg (ref 26.0–34.0)
MCHC: 36 g/dL (ref 30.0–36.0)
MCV: 90.3 fL (ref 78.0–100.0)
PLATELETS: 64 10*3/uL — AB (ref 150–400)
RBC: 3.91 MIL/uL — ABNORMAL LOW (ref 4.22–5.81)
RDW: 15.4 % (ref 11.5–15.5)
WBC: 5.6 10*3/uL (ref 4.0–10.5)

## 2014-09-09 LAB — HEPATIC FUNCTION PANEL
ALT: 82 U/L — AB (ref 17–63)
AST: 174 U/L — ABNORMAL HIGH (ref 15–41)
Albumin: 2.1 g/dL — ABNORMAL LOW (ref 3.5–5.0)
Alkaline Phosphatase: 195 U/L — ABNORMAL HIGH (ref 38–126)
BILIRUBIN INDIRECT: 2.5 mg/dL — AB (ref 0.3–0.9)
BILIRUBIN TOTAL: 3.6 mg/dL — AB (ref 0.3–1.2)
Bilirubin, Direct: 1.1 mg/dL — ABNORMAL HIGH (ref 0.1–0.5)
Total Protein: 5.6 g/dL — ABNORMAL LOW (ref 6.5–8.1)

## 2014-09-09 LAB — LIPASE, BLOOD: LIPASE: 150 U/L — AB (ref 22–51)

## 2014-09-09 LAB — AFP TUMOR MARKER: AFP-Tumor Marker: 5.6 ng/mL (ref 0.0–8.3)

## 2014-09-09 LAB — HEPATITIS PANEL, ACUTE
HCV AB: NEGATIVE
Hep A IgM: NONREACTIVE
Hep B C IgM: NONREACTIVE
Hepatitis B Surface Ag: POSITIVE — AB

## 2014-09-09 LAB — PROTIME-INR
INR: 1.74 — ABNORMAL HIGH (ref 0.00–1.49)
PROTHROMBIN TIME: 20.3 s — AB (ref 11.6–15.2)

## 2014-09-09 LAB — HEPATITIS B SURF AG CONFIRMATION: Hepatitis B Surf Ag Confirmation: POSITIVE — AB

## 2014-09-09 MED ORDER — CIPROFLOXACIN IN D5W 400 MG/200ML IV SOLN
400.0000 mg | Freq: Two times a day (BID) | INTRAVENOUS | Status: DC
  Filled 2014-09-09 (×2): qty 200

## 2014-09-09 MED ORDER — ACETAMINOPHEN 325 MG PO TABS
650.0000 mg | ORAL_TABLET | Freq: Once | ORAL | Status: AC
  Administered 2014-09-09: 650 mg via ORAL
  Filled 2014-09-09: qty 2

## 2014-09-09 MED ORDER — MORPHINE SULFATE 4 MG/ML IJ SOLN
2.9000 mg | Freq: Once | INTRAMUSCULAR | Status: AC
  Administered 2014-09-09: 2.9 mg via INTRAVENOUS

## 2014-09-09 MED ORDER — TECHNETIUM TC 99M MEBROFENIN IV KIT
5.0000 | PACK | Freq: Once | INTRAVENOUS | Status: AC | PRN
  Administered 2014-09-09: 5 via INTRAVENOUS

## 2014-09-09 MED ORDER — MORPHINE SULFATE 4 MG/ML IJ SOLN
INTRAMUSCULAR | Status: AC
  Filled 2014-09-09: qty 1

## 2014-09-09 MED ORDER — CEFTRIAXONE SODIUM IN DEXTROSE 40 MG/ML IV SOLN
2.0000 g | INTRAVENOUS | Status: DC
  Administered 2014-09-09 – 2014-09-10 (×2): 2 g via INTRAVENOUS
  Filled 2014-09-09 (×3): qty 50

## 2014-09-09 MED ORDER — PANTOPRAZOLE SODIUM 40 MG PO TBEC
40.0000 mg | DELAYED_RELEASE_TABLET | Freq: Every day | ORAL | Status: DC
  Administered 2014-09-09 – 2014-09-13 (×5): 40 mg via ORAL
  Filled 2014-09-09 (×4): qty 1

## 2014-09-09 NOTE — Progress Notes (Signed)
Daily Rounding Note  09/09/2014, 9:15 AM  LOS: 2 days   SUBJECTIVE:       Slept well. Had a bowel movement, which was not bloody or dark, yesterday.  It's been many hours since he had any nausea. Abdominal pain has also completely resolved.  OBJECTIVE:         Vital signs in last 24 hours:    Temp:  [98.7 F (37.1 C)-102.4 F (39.1 C)] 100.3 F (37.9 C) (05/20 0848) Pulse Rate:  [77-99] 81 (05/20 0848) Resp:  [18-21] 18 (05/20 0848) BP: (105-128)/(58-83) 112/70 mmHg (05/20 0848) SpO2:  [91 %-99 %] 98 % (05/20 0848) Last BM Date: 09/09/14 Filed Weights   09/07/14 2133 09/08/14 0500  Weight: 162 lb 9.6 oz (73.755 kg) 162 lb 9.4 oz (73.75 kg)   General: Comfortable, looks less ill than yesterday.   Heart: RRR. Chest: No dyspnea, no cough. No labored breathing. Breath sounds diminished bilaterally. Abdomen: Soft, NT, ND.  BS hypoactive  Extremities: no CCE Neuro/Psych:  Pleasant, cooperative, oriented x 3.  Fully alert.   Intake/Output from previous day: 05/19 0701 - 05/20 0700 In: -  Out: 400 [Urine:400]  Intake/Output this shift:    Lab Results:  Recent Labs  09/08/14 0320 09/08/14 1214 09/09/14 0344  WBC 6.2 6.5 5.6  HGB 13.1 12.4* 12.7*  HCT 36.4* 34.8* 35.3*  PLT 43* 40* 64*   BMET  Recent Labs  09/07/14 1555 09/08/14 0320 09/09/14 0344  NA 132* 132* 135  K 4.3 3.1* 4.5  CL 91* 96* 103  CO2  --  26 27  GLUCOSE 137* 108* 112*  BUN 14 7 11   CREATININE 0.60* 0.55* 0.69  CALCIUM  --  7.5* 7.3*   LFT  Recent Labs  09/07/14 1547 09/08/14 0320 09/09/14 0344  PROT 7.0 7.1 5.6*  ALBUMIN 2.9* 2.7* 2.1*  AST 200* 191* 174*  ALT 103* 98* 82*  ALKPHOS 217* 211* 195*  BILITOT 3.2* 2.8* 3.6*  BILIDIR 1.2*  --  1.1*  IBILI 2.0*  --  2.5*   PT/INR  Recent Labs  09/07/14 1801  LABPROT 16.8*  INR 1.35   Hepatitis Panel  Recent Labs  09/08/14 1214  HEPBSAG PENDING  HCVAB NEGATIVE   HEPAIGM NON REACTIVE  HEPBIGM NON REACTIVE    Studies/Results: Mr Abdomen W Wo Contrast  09/09/2014   ADDENDUM REPORT: 09/09/2014 08:42  ADDENDUM: I discussed these findings by telephone with Dr. Gwenlyn PerkingMadera at approximately [phone] on 09/09/2014.   Electronically Signed   By: Kennith CenterEric  Mansell M.D.   On: 09/09/2014 08:42   09/09/2014   CLINICAL DATA:  Initial encounter for three-week history of myalgia, nausea, and vomiting with flu-like symptoms. Now with acute onset of abdominal pain  EXAM: MRI ABDOMEN WITHOUT AND WITH CONTRAST  TECHNIQUE: Multiplanar multisequence MR imaging of the abdomen was performed both before and after the administration of intravenous contrast.  CONTRAST:  15mL MULTIHANCE GADOBENATE DIMEGLUMINE 529 MG/ML IV SOLN  COMPARISON:  CT scan from 09/07/2014.  FINDINGS: Lower chest: Subsegmental atelectasis seen in the right lung base above the asymmetrically elevated right hemidiaphragm.  Hepatobiliary: Markedly deformed enter regular liver compatible with advanced cirrhosis. Areas of parenchymal and capsular retraction centrally compatible with areas of confluent hepatic fibrosis. Motion degradation on postcontrast imaging makes assessment for hepatoma suboptimal. No definite enhancing hepatoma is identified.  Gallbladder is massively dilated with lumen full of debris/hemorrhage/ sludge. Associated diffuse gallbladder wall thickening  is evident. No evidence for intra or extrahepatic biliary duct dilatation.  Pancreas: No focal mass lesion. No dilatation of the main duct. No intraparenchymal cyst. No peripancreatic edema.  Spleen: No splenomegaly. No focal mass lesion.  Adrenals/Urinary Tract: No adrenal nodule or mass. No hydronephrosis or renal mass is evident.  Stomach/Bowel: Stomach is nondistended. No evidence for small bowel dilatation.  Vascular/Lymphatic: No abdominal aortic aneurysm. The portal vein appears patent.  Other: Small volume intraperitoneal free fluid appears slightly  increased in the interval.  Musculoskeletal: No evidence for suspicious marrow enhancement within the visualized bony anatomy.  IMPRESSION: 1. Markedly dilated gallbladder is positioned anterior to the cirrhotic liver. Gallbladder lumen is diffusely filled with blood/sludge/debris with associated gallbladder wall thickening. Cholecystitis is a distinct consideration. 2. Markedly irregular informed liver with areas of confluent hepatic fibrosis. There is substantial motion artifact on this study which markedly limits assessment for hepatoma. Close followup is recommended and repeat MRI after resolution of acute symptoms, when the patient is better able to cooperate with breath holding is recommended to further evaluate.  Electronically Signed: By: Kennith Center M.D. On: 09/09/2014 08:37   Ct Abdomen Pelvis W Contrast  09/07/2014   CLINICAL DATA:  Acute right upper quadrant abdominal pain.  EXAM: CT ABDOMEN AND PELVIS WITH CONTRAST  TECHNIQUE: Multidetector CT imaging of the abdomen and pelvis was performed using the standard protocol following bolus administration of intravenous contrast.  CONTRAST:  OMNIPAQUE IOHEXOL 300 MG/ML  SOLN  COMPARISON:  None.  FINDINGS: Mild multilevel degenerative disc disease is noted in the lumbar spine. Visualized lung bases appear normal.  The spleen and pancreas appear normal. Gallbladder is not clearly identified, but no definite stones are noted. The liver is severely abnormal in shape and density with enlargement of the caudate lobe in severe small size of the right hepatic lobe, most consistent with severe hepatic cirrhosis. There is abnormal ill-defined density seen throughout the superior portion of the right hepatic lobe concerning for possible neoplasm. Adrenal glands and kidneys appear normal. Enlarged collateral veins are noted suggesting portal hypertension. There is no evidence of bowel obstruction. No abnormal fluid collection is noted. Urinary bladder appears  normal. Abdominal aorta appears normal.  IMPRESSION: Findings are most consistent with severe hepatic cirrhosis with collateral veins suggesting portal hypertension. Large area of abnormal irregular density is seen involving the superior portion of the right hepatic lobe concerning for possible neoplasm such as hepatocellular carcinoma. MRI scan is recommended for further evaluation.   Electronically Signed   By: Lupita Raider, M.D.   On: 09/07/2014 17:28   Dg Abd Acute W/chest  09/07/2014   CLINICAL DATA:  Flu-like symptoms for 3 weeks  EXAM: DG ABDOMEN ACUTE W/ 1V CHEST  COMPARISON:  07/07/14  FINDINGS: There is no evidence of dilated bowel loops or free intraperitoneal air. No radiopaque calculi or other significant radiographic abnormality is seen. Heart size and mediastinal contours are within normal limits. Both lungs are clear. Postsurgical changes are noted in the left clavicle  IMPRESSION: No acute abnormality noted.   Electronically Signed   By: Alcide Clever M.D.   On: 09/07/2014 16:23   US Abdomen Limited Ruq  09/08/2014   CLINICAL DATA:  Abdominal pain.  EXAM: US ABDOMEN LIMITED - RIGHT UPPER QUADRANT  COMPARISON:  CT 1 day prior.  FINDINGS: Gallbladder:  Not visualized.  Common bile duct:  Diameter: Not visualized.  Liver:  Technically limited exam due at bowel gas. Irregularly-shaped liver with  nodular hepatic contour. Liver parenchyma is diffusely heterogeneous with question of multiple hepatic lesion. Hepatopetal blood flow is noted in the main portal vein with velocities 34 cm/second. The hepatic vasculature evaluation is limited and not clearly defined. No definite ascites in the right upper quadrant.  IMPRESSION: Markedly limited exam demonstrating irregular hepatic nodular contours, diffusely hepatic heterogeneous echotexture, and question of multiple hepatic lesions, all suboptimally assessed. MRI of the abdomen hepatic mass protocol is recommended for further characterization. The  gallbladder as well as common bile duct are not visualized. The hepatic vasculature is not well demonstrated, and complete Doppler evaluation could not be performed.   Electronically Signed   By: Rubye OaksMelanie  Ehinger M.D.   On: 09/08/2014 06:26   Scheduled Meds: . ciprofloxacin  400 mg Intravenous Q12H  . folic acid  1 mg Oral Daily  . LORazepam  0-4 mg Intravenous 4 times per day  . LORazepam  0-4 mg Intravenous Q12H  . pantoprazole  40 mg Oral Q0600  . thiamine  100 mg Intravenous Daily  . thiamine  100 mg Oral Daily   Continuous Infusions: . dextrose 5 % and 0.9 % NaCl with KCl 40 mEq/L 75 mL/hr (09/08/14 1223)   PRN Meds:.sodium chloride, morphine injection, ondansetron (ZOFRAN) IV  ASSESMENT:   1. Acute alcoholic hepatitis.  Improved.   2. Hepatic cirrhosis with possible portal hypertension  3. Chronic alcoholism  4. Right liver mass. Rule out hepatoma.  MRCP not able to define the mass.   5. Hemoccult-positive stool. No active GI bleeding. Normal hemoglobin BUN   6.  GB sludge/blood/debrsis and thickened GB wall.  R/o cholecystitis. His clinical history certainly supports a diagnosis of cholecystitis given right upper quadrant pain acutely. Nausea vomiting could have multiple etiologies including his alcohol intake as well as acute biliary disease. LFTs improved.    PLAN   *  Note that surgical consult is in process. Note that perc cholecystostomy ordered by Surgical PA, no note yet.  *  Hospitalist ordered abx.   *  Would start clears after perc drain.    Jennye MoccasinSarah Gribbin  09/09/2014, 9:15 AM Pager: [phone]  GI ATTENDING  Interval history data reviewed. Patient seen and examined. Agree with interval progress note. IMPRESSION 1. Acute abdominal pain secondary to gallbladder as described. Currently without significant pain 2. Alcoholic hepatitis. Ongoing 3. Chronic alcoholism. Ongoing RECOMMENDATIONS 1. Surgery to address and manage gallbladder issue 2. Other  recommendations per my note yesterday.  GI available as needed. Will sign off  Theoplis Garciagarcia N. Eda KeysPerry, Jr., M.D. [facility] Division of Gastroenterology

## 2014-09-09 NOTE — Consult Note (Signed)
Reason for Consult: fevers and RUQ abdominal pain Referring Physician: Dr. Jackelyn Knife   HPI: Blake Lambert is a 44 year old male with a history of cirrhosis, alcoholism admitted with RUQ abdominal pain.  Duration of symptoms is 2 days. Onset was sudden.  Coarse is improving.  Time pattern is constant.  Moderate in severity.  Characterized as sharp and stabbing pain.  No modifying factors.  No aggravating or alleviating factors.  Associated with fevers, chills, nausea and vomiting.  He endorses to drinking 12-15 12oz beers per day, occasional marijuana use and 3/4 ppd smoker.  He denies previous abdominal surgeries.  He has been NPO.  He has a CT scan of abdomen and pelvis on 5/18 which showed severe hepatic cirrhosis with collateral veins suggesting portal hypertension, a large irregular density over the right hepatic lobe concerning carcinoma.  This was followed a abdominal US which was limited.  The patient then had a MRI of abdomen on 5/19 which showed a markedly dilated gallbladder, sludge/debris and wall thickening.  We have therefore been asked to evaluate.    LFTs are elevated with T bili 3.6.  WBC is normal.  INR on 5/18 was 1.3.  The patient is NPO.  Fevers T max 102.4.  His pain has improved and n/v have resolved.    Past Medical History  Diagnosis Date  . Shoulder injury   . Cirrhosis of liver 08/2014.  . Liver mass, right lobe 08/2014.  Marland Kitchen Alcoholism 08/2014.    Past Surgical History  Procedure Laterality Date  . Shoulder surgery      L shoulder  . Wrist surgery Bilateral     Following [facility].    Family History  Problem Relation Age of Onset  . Cancer Mother     unkown type of CA    Social History:  reports that he has been smoking Cigarettes.  He has been smoking about 1.00 pack per day. He does not have any smokeless tobacco history on file. He reports that he drinks about 3.6 oz of alcohol per week. He reports that he uses illicit drugs.  Allergies: No Known  Allergies  Medications:  No current facility-administered medications on file prior to encounter.   Current Outpatient Prescriptions on File Prior to Encounter  Medication Sig Dispense Refill  . Aspirin-Acetaminophen-Caffeine (GOODYS EXTRA STRENGTH PO) Take 1 Package by mouth daily as needed (pain, headache).       Results for orders placed or performed during the hospital encounter of 09/07/14 (from the past 48 hour(s))  CBC with Differential/Platelet     Status: Abnormal   Collection Time: 09/07/14  3:47 PM  Result Value Ref Range   WBC 7.1 4.0 - 10.5 K/uL   RBC 4.12 (L) 4.22 - 5.81 MIL/uL   Hemoglobin 13.3 13.0 - 17.0 g/dL   HCT 36.6 (L) 39.0 - 52.0 %   MCV 88.8 78.0 - 100.0 fL   MCH 32.3 26.0 - 34.0 pg   MCHC 36.3 (H) 30.0 - 36.0 g/dL   RDW 14.9 11.5 - 15.5 %   Platelets 91 (L) 150 - 400 K/uL    Comment: REPEATED TO VERIFY SPECIMEN CHECKED FOR CLOTS PLATELET COUNT CONFIRMED BY SMEAR    Neutrophils Relative % 64 43 - 77 %   Neutro Abs 4.6 1.7 - 7.7 K/uL   Lymphocytes Relative 18 12 - 46 %   Lymphs Abs 1.3 0.7 - 4.0 K/uL   Monocytes Relative 18 (H) 3 - 12 %   Monocytes  Absolute 1.2 (H) 0.1 - 1.0 K/uL   Eosinophils Relative 0 0 - 5 %   Eosinophils Absolute 0.0 0.0 - 0.7 K/uL   Basophils Relative 0 0 - 1 %   Basophils Absolute 0.0 0.0 - 0.1 K/uL  Lipase, blood     Status: Abnormal   Collection Time: 09/07/14  3:47 PM  Result Value Ref Range   Lipase 182 (H) 22 - 51 U/L  Hepatic function panel     Status: Abnormal   Collection Time: 09/07/14  3:47 PM  Result Value Ref Range   Total Protein 7.0 6.5 - 8.1 g/dL   Albumin 2.9 (L) 3.5 - 5.0 g/dL   AST 200 (H) 15 - 41 U/L   ALT 103 (H) 17 - 63 U/L   Alkaline Phosphatase 217 (H) 38 - 126 U/L   Total Bilirubin 3.2 (H) 0.3 - 1.2 mg/dL   Bilirubin, Direct 1.2 (H) 0.1 - 0.5 mg/dL   Indirect Bilirubin 2.0 (H) 0.3 - 0.9 mg/dL  I-stat chem 8, ed     Status: Abnormal   Collection Time: 09/07/14  3:55 PM  Result Value Ref Range    Sodium 132 (L) 135 - 145 mmol/L   Potassium 4.3 3.5 - 5.1 mmol/L   Chloride 91 (L) 101 - 111 mmol/L   BUN 14 6 - 20 mg/dL   Creatinine, Ser 0.60 (L) 0.61 - 1.24 mg/dL   Glucose, Bld 137 (H) 65 - 99 mg/dL   Calcium, Ion 0.95 (L) 1.12 - 1.23 mmol/L   TCO2 23 0 - 100 mmol/L   Hemoglobin 14.6 13.0 - 17.0 g/dL   HCT 43.0 39.0 - 52.0 %  I-Stat CG4 Lactic Acid, ED     Status: Abnormal   Collection Time: 09/07/14  3:55 PM  Result Value Ref Range   Lactic Acid, Venous 5.34 (HH) 0.5 - 2.0 mmol/L   Comment NOTIFIED PHYSICIAN   POC occult blood, ED RN will collect     Status: Abnormal   Collection Time: 09/07/14  4:09 PM  Result Value Ref Range   Fecal Occult Bld POSITIVE (A) NEGATIVE  Urinalysis, Routine w reflex microscopic     Status: Abnormal   Collection Time: 09/07/14  4:25 PM  Result Value Ref Range   Color, Urine RED (A) YELLOW    Comment: BIOCHEMICALS MAY BE AFFECTED BY COLOR   APPearance CLOUDY (A) CLEAR   Specific Gravity, Urine 1.028 1.005 - 1.030   pH 7.5 5.0 - 8.0   Glucose, UA NEGATIVE NEGATIVE mg/dL   Hgb urine dipstick NEGATIVE NEGATIVE   Bilirubin Urine MODERATE (A) NEGATIVE   Ketones, ur 15 (A) NEGATIVE mg/dL   Protein, ur 30 (A) NEGATIVE mg/dL   Urobilinogen, UA 4.0 (H) 0.0 - 1.0 mg/dL   Nitrite POSITIVE (A) NEGATIVE   Leukocytes, UA SMALL (A) NEGATIVE  Urine microscopic-add on     Status: None   Collection Time: 09/07/14  4:25 PM  Result Value Ref Range   Squamous Epithelial / LPF RARE RARE   WBC, UA 3-6 <3 WBC/hpf   RBC / HPF 3-6 <3 RBC/hpf   Urine-Other MUCOUS PRESENT   Urine culture     Status: None (Preliminary result)   Collection Time: 09/07/14  4:25 PM  Result Value Ref Range   Specimen Description URINE, CATHETERIZED    Special Requests NONE    Culture      Culture reincubated for better growth Performed at [facility] PENDING  Protime-INR     Status: Abnormal   Collection Time: 09/07/14  6:01 PM  Result Value Ref  Range   Prothrombin Time 16.8 (H) 11.6 - 15.2 seconds   INR 1.35 0.00 - 1.49  Acetaminophen level     Status: Abnormal   Collection Time: 09/07/14  6:26 PM  Result Value Ref Range   Acetaminophen (Tylenol), Serum <10 (L) 10 - 30 ug/mL    Comment:        THERAPEUTIC CONCENTRATIONS VARY SIGNIFICANTLY. A RANGE OF 10-30 ug/mL MAY BE AN EFFECTIVE CONCENTRATION FOR MANY PATIENTS. HOWEVER, SOME ARE BEST TREATED AT CONCENTRATIONS OUTSIDE THIS RANGE. ACETAMINOPHEN CONCENTRATIONS >150 ug/mL AT 4 HOURS AFTER INGESTION AND >50 ug/mL AT 12 HOURS AFTER INGESTION ARE OFTEN ASSOCIATED WITH TOXIC REACTIONS.   Ammonia     Status: None   Collection Time: 09/07/14  6:26 PM  Result Value Ref Range   Ammonia 31 9 - 35 umol/L  HIV antibody     Status: None   Collection Time: 09/07/14  6:26 PM  Result Value Ref Range   HIV Screen 4th Generation wRfx Non Reactive Non Reactive    Comment: (NOTE) Performed At: [facility] [place], [place] [id number] Lindon Romp MD [id number]   Amylase     Status: Abnormal   Collection Time: 09/07/14  6:26 PM  Result Value Ref Range   Amylase 139 (H) 28 - 100 U/L  Lactate dehydrogenase     Status: Abnormal   Collection Time: 09/07/14  6:26 PM  Result Value Ref Range   LDH 240 (H) 98 - 192 U/L  Lactic acid, plasma     Status: None   Collection Time: 09/07/14  6:26 PM  Result Value Ref Range   Lactic Acid, Venous 1.8 0.5 - 2.0 mmol/L  Type and screen     Status: None   Collection Time: 09/07/14  6:26 PM  Result Value Ref Range   ABO/RH(D) O POS    Antibody Screen POS    Sample Expiration 09/10/2014    DAT, IgG NEG    Antibody Identification NO CLINICALLY SIGNIFICANT ANTIBODY IDENTIFIED   Lactic acid, plasma     Status: None   Collection Time: 09/07/14  8:04 PM  Result Value Ref Range   Lactic Acid, Venous 1.7 0.5 - 2.0 mmol/L  I-Stat CG4 Lactic Acid, ED     Status: None   Collection Time: 09/07/14  8:13 PM  Result Value  Ref Range   Lactic Acid, Venous 1.54 0.5 - 2.0 mmol/L  MRSA PCR Screening     Status: None   Collection Time: 09/07/14  9:33 PM  Result Value Ref Range   MRSA by PCR NEGATIVE NEGATIVE    Comment:        The GeneXpert MRSA Assay (FDA approved for NASAL specimens only), is one component of a comprehensive MRSA colonization surveillance program. It is not intended to diagnose MRSA infection nor to guide or monitor treatment for MRSA infections.   CBC     Status: Abnormal   Collection Time: 09/08/14  3:20 AM  Result Value Ref Range   WBC 6.2 4.0 - 10.5 K/uL   RBC 4.06 (L) 4.22 - 5.81 MIL/uL   Hemoglobin 13.1 13.0 - 17.0 g/dL   HCT 36.4 (L) 39.0 - 52.0 %   MCV 89.7 78.0 - 100.0 fL   MCH 32.3 26.0 - 34.0 pg   MCHC 36.0 30.0 - 36.0 g/dL   RDW 14.9 11.5 -  15.5 %   Platelets 43 (L) 150 - 400 K/uL    Comment: REPEATED TO VERIFY CONSISTENT WITH PREVIOUS RESULT   Magnesium     Status: Abnormal   Collection Time: 09/08/14  3:20 AM  Result Value Ref Range   Magnesium 1.0 (L) 1.7 - 2.4 mg/dL  Phosphorus     Status: None   Collection Time: 09/08/14  3:20 AM  Result Value Ref Range   Phosphorus 2.7 2.5 - 4.6 mg/dL  Comprehensive metabolic panel     Status: Abnormal   Collection Time: 09/08/14  3:20 AM  Result Value Ref Range   Sodium 132 (L) 135 - 145 mmol/L   Potassium 3.1 (L) 3.5 - 5.1 mmol/L    Comment: DELTA CHECK NOTED   Chloride 96 (L) 101 - 111 mmol/L   CO2 26 22 - 32 mmol/L   Glucose, Bld 108 (H) 65 - 99 mg/dL   BUN 7 6 - 20 mg/dL   Creatinine, Ser 0.55 (L) 0.61 - 1.24 mg/dL   Calcium 7.5 (L) 8.9 - 10.3 mg/dL   Total Protein 7.1 6.5 - 8.1 g/dL   Albumin 2.7 (L) 3.5 - 5.0 g/dL   AST 191 (H) 15 - 41 U/L   ALT 98 (H) 17 - 63 U/L   Alkaline Phosphatase 211 (H) 38 - 126 U/L   Total Bilirubin 2.8 (H) 0.3 - 1.2 mg/dL   GFR calc non Af Amer >60 >60 mL/min   GFR calc Af Amer >60 >60 mL/min    Comment: (NOTE) The eGFR has been calculated using the CKD EPI equation. This  calculation has not been validated in all clinical situations. eGFR's persistently <60 mL/min signify possible Chronic Kidney Disease.    Anion gap 10 5 - 15  Lipase, blood     Status: Abnormal   Collection Time: 09/08/14  3:20 AM  Result Value Ref Range   Lipase 123 (H) 22 - 51 U/L  Amylase     Status: Abnormal   Collection Time: 09/08/14  3:20 AM  Result Value Ref Range   Amylase 119 (H) 28 - 100 U/L  Gamma GT     Status: Abnormal   Collection Time: 09/08/14  3:20 AM  Result Value Ref Range   GGT 467 (H) 7 - 50 U/L  Culture, blood (routine x 2)     Status: None (Preliminary result)   Collection Time: 09/08/14 11:55 AM  Result Value Ref Range   Specimen Description BLOOD RIGHT ANTECUBITAL    Special Requests BOTTLES DRAWN AEROBIC AND ANAEROBIC 10CC 10CC    Culture             BLOOD CULTURE RECEIVED NO GROWTH TO DATE CULTURE WILL BE HELD FOR 5 DAYS BEFORE ISSUING A FINAL NEGATIVE REPORT Performed at [facility]    Report Status PENDING   AFP tumor marker     Status: None   Collection Time: 09/08/14 12:14 PM  Result Value Ref Range   AFP-Tumor Marker 5.6 0.0 - 8.3 ng/mL    Comment: (NOTE) Roche ECLIA methodology Performed At: [facility] [address] [place], [place] [id number] Lindon Romp MD [id number]   Hepatitis panel, acute     Status: None (Preliminary result)   Collection Time: 09/08/14 12:14 PM  Result Value Ref Range   Hepatitis B Surface Ag PENDING NEGATIVE   HCV Ab NEGATIVE NEGATIVE   Hep A IgM NON REACTIVE NON REACTIVE    Comment: (NOTE) Effective March 07, 2014,  Hepatitis Acute Panel (test code [id number]) will be revised to automatically reflex to the Hepatitis C Viral RNA, Quantitative, Real-Time PCR assay if the Hepatitis C antibody screening result is Reactive. This action is being taken to ensure that the CDC/USPSTF recommended HCV diagnostic algorithm with the appropriate test reflex needed for accurate interpretation  is followed.    Hep B C IgM NON REACTIVE NON REACTIVE    Comment: (NOTE) High levels of Hepatitis B Core IgM antibody are detectable during the acute stage of Hepatitis B. This antibody is used to differentiate current from past HBV infection. Performed at [facility]   CBC Once     Status: Abnormal   Collection Time: 09/08/14 12:14 PM  Result Value Ref Range   WBC 6.5 4.0 - 10.5 K/uL   RBC 3.89 (L) 4.22 - 5.81 MIL/uL   Hemoglobin 12.4 (L) 13.0 - 17.0 g/dL   HCT 34.8 (L) 39.0 - 52.0 %   MCV 89.5 78.0 - 100.0 fL   MCH 31.9 26.0 - 34.0 pg   MCHC 35.6 30.0 - 36.0 g/dL   RDW 15.1 11.5 - 15.5 %   Platelets 40 (L) 150 - 400 K/uL    Comment: CONSISTENT WITH PREVIOUS RESULT  Culture, blood (routine x 2)     Status: None (Preliminary result)   Collection Time: 09/08/14 12:16 PM  Result Value Ref Range   Specimen Description BLOOD RIGHT ARM    Special Requests BOTTLES DRAWN AEROBIC AND ANAEROBIC 10CC 10CC    Culture             BLOOD CULTURE RECEIVED NO GROWTH TO DATE CULTURE WILL BE HELD FOR 5 DAYS BEFORE ISSUING A FINAL NEGATIVE REPORT Performed at [facility]    Report Status PENDING   Basic metabolic panel     Status: Abnormal   Collection Time: 09/09/14  3:44 AM  Result Value Ref Range   Sodium 135 135 - 145 mmol/L   Potassium 4.5 3.5 - 5.1 mmol/L    Comment: DELTA CHECK NOTED   Chloride 103 101 - 111 mmol/L   CO2 27 22 - 32 mmol/L   Glucose, Bld 112 (H) 65 - 99 mg/dL   BUN 11 6 - 20 mg/dL   Creatinine, Ser 0.69 0.61 - 1.24 mg/dL   Calcium 7.3 (L) 8.9 - 10.3 mg/dL   GFR calc non Af Amer >60 >60 mL/min   GFR calc Af Amer >60 >60 mL/min    Comment: (NOTE) The eGFR has been calculated using the CKD EPI equation. This calculation has not been validated in all clinical situations. eGFR's persistently <60 mL/min signify possible Chronic Kidney Disease.    Anion gap 5 5 - 15  CBC     Status: Abnormal   Collection Time: 09/09/14  3:44 AM  Result Value Ref  Range   WBC 5.6 4.0 - 10.5 K/uL   RBC 3.91 (L) 4.22 - 5.81 MIL/uL   Hemoglobin 12.7 (L) 13.0 - 17.0 g/dL   HCT 35.3 (L) 39.0 - 52.0 %   MCV 90.3 78.0 - 100.0 fL   MCH 32.5 26.0 - 34.0 pg   MCHC 36.0 30.0 - 36.0 g/dL   RDW 15.4 11.5 - 15.5 %   Platelets 64 (L) 150 - 400 K/uL    Comment: CONSISTENT WITH PREVIOUS RESULT  Lipase, blood     Status: Abnormal   Collection Time: 09/09/14  3:44 AM  Result Value Ref Range   Lipase 150 (H) 22 -  51 U/L  Hepatic function panel     Status: Abnormal   Collection Time: 09/09/14  3:44 AM  Result Value Ref Range   Total Protein 5.6 (L) 6.5 - 8.1 g/dL   Albumin 2.1 (L) 3.5 - 5.0 g/dL   AST 174 (H) 15 - 41 U/L   ALT 82 (H) 17 - 63 U/L   Alkaline Phosphatase 195 (H) 38 - 126 U/L   Total Bilirubin 3.6 (H) 0.3 - 1.2 mg/dL   Bilirubin, Direct 1.1 (H) 0.1 - 0.5 mg/dL   Indirect Bilirubin 2.5 (H) 0.3 - 0.9 mg/dL    Mr Abdomen W Wo Contrast  09/09/2014   ADDENDUM REPORT: 09/09/2014 08:42  ADDENDUM: I discussed these findings by telephone with Dr. Dyann Kief at approximately [phone] hours on 09/09/2014.   Electronically Signed   By: Misty Stanley M.D.   On: 09/09/2014 08:42   09/09/2014   CLINICAL DATA:  Initial encounter for three-week history of myalgia, nausea, and vomiting with flu-like symptoms. Now with acute onset of abdominal pain  EXAM: MRI ABDOMEN WITHOUT AND WITH CONTRAST  TECHNIQUE: Multiplanar multisequence MR imaging of the abdomen was performed both before and after the administration of intravenous contrast.  CONTRAST:  52m MULTIHANCE GADOBENATE DIMEGLUMINE 529 MG/ML IV SOLN  COMPARISON:  CT scan from 09/07/2014.  FINDINGS: Lower chest: Subsegmental atelectasis seen in the right lung base above the asymmetrically elevated right hemidiaphragm.  Hepatobiliary: Markedly deformed enter regular liver compatible with advanced cirrhosis. Areas of parenchymal and capsular retraction centrally compatible with areas of confluent hepatic fibrosis. Motion  degradation on postcontrast imaging makes assessment for hepatoma suboptimal. No definite enhancing hepatoma is identified.  Gallbladder is massively dilated with lumen full of debris/hemorrhage/ sludge. Associated diffuse gallbladder wall thickening is evident. No evidence for intra or extrahepatic biliary duct dilatation.  Pancreas: No focal mass lesion. No dilatation of the main duct. No intraparenchymal cyst. No peripancreatic edema.  Spleen: No splenomegaly. No focal mass lesion.  Adrenals/Urinary Tract: No adrenal nodule or mass. No hydronephrosis or renal mass is evident.  Stomach/Bowel: Stomach is nondistended. No evidence for small bowel dilatation.  Vascular/Lymphatic: No abdominal aortic aneurysm. The portal vein appears patent.  Other: Small volume intraperitoneal free fluid appears slightly increased in the interval.  Musculoskeletal: No evidence for suspicious marrow enhancement within the visualized bony anatomy.  IMPRESSION: 1. Markedly dilated gallbladder is positioned anterior to the cirrhotic liver. Gallbladder lumen is diffusely filled with blood/sludge/debris with associated gallbladder wall thickening. Cholecystitis is a distinct consideration. 2. Markedly irregular informed liver with areas of confluent hepatic fibrosis. There is substantial motion artifact on this study which markedly limits assessment for hepatoma. Close followup is recommended and repeat MRI after resolution of acute symptoms, when the patient is better able to cooperate with breath holding is recommended to further evaluate.  Electronically Signed: By: EMisty StanleyM.D. On: 09/09/2014 08:37   Ct Abdomen Pelvis W Contrast  09/07/2014   CLINICAL DATA:  Acute right upper quadrant abdominal pain.  EXAM: CT ABDOMEN AND PELVIS WITH CONTRAST  TECHNIQUE: Multidetector CT imaging of the abdomen and pelvis was performed using the standard protocol following bolus administration of intravenous contrast.  CONTRAST:  1052m OMNIPAQUE IOHEXOL 300 MG/ML  SOLN  COMPARISON:  None.  FINDINGS: Mild multilevel degenerative disc disease is noted in the lumbar spine. Visualized lung bases appear normal.  The spleen and pancreas appear normal. Gallbladder is not clearly identified, but no definite stones are noted. The liver is severely  abnormal in shape and density with enlargement of the caudate lobe in severe small size of the right hepatic lobe, most consistent with severe hepatic cirrhosis. There is abnormal ill-defined density seen throughout the superior portion of the right hepatic lobe concerning for possible neoplasm. Adrenal glands and kidneys appear normal. Enlarged collateral veins are noted suggesting portal hypertension. There is no evidence of bowel obstruction. No abnormal fluid collection is noted. Urinary bladder appears normal. Abdominal aorta appears normal.  IMPRESSION: Findings are most consistent with severe hepatic cirrhosis with collateral veins suggesting portal hypertension. Large area of abnormal irregular density is seen involving the superior portion of the right hepatic lobe concerning for possible neoplasm such as hepatocellular carcinoma. MRI scan is recommended for further evaluation.   Electronically Signed   By: Marijo Conception, M.D.   On: 09/07/2014 17:28   Dg Abd Acute W/chest  09/07/2014   CLINICAL DATA:  Flu-like symptoms for 3 weeks  EXAM: DG ABDOMEN ACUTE W/ 1V CHEST  COMPARISON:  07/07/14  FINDINGS: There is no evidence of dilated bowel loops or free intraperitoneal air. No radiopaque calculi or other significant radiographic abnormality is seen. Heart size and mediastinal contours are within normal limits. Both lungs are clear. Postsurgical changes are noted in the left clavicle  IMPRESSION: No acute abnormality noted.   Electronically Signed   By: Inez Catalina M.D.   On: 09/07/2014 16:23   US Abdomen Limited Ruq  09/08/2014   CLINICAL DATA:  Abdominal pain.  EXAM: US ABDOMEN LIMITED - RIGHT  UPPER QUADRANT  COMPARISON:  CT 1 day prior.  FINDINGS: Gallbladder:  Not visualized.  Common bile duct:  Diameter: Not visualized.  Liver:  Technically limited exam due at bowel gas. Irregularly-shaped liver with nodular hepatic contour. Liver parenchyma is diffusely heterogeneous with question of multiple hepatic lesion. Hepatopetal blood flow is noted in the main portal vein with velocities 34 cm/second. The hepatic vasculature evaluation is limited and not clearly defined. No definite ascites in the right upper quadrant.  IMPRESSION: Markedly limited exam demonstrating irregular hepatic nodular contours, diffusely hepatic heterogeneous echotexture, and question of multiple hepatic lesions, all suboptimally assessed. MRI of the abdomen hepatic mass protocol is recommended for further characterization. The gallbladder as well as common bile duct are not visualized. The hepatic vasculature is not well demonstrated, and complete Doppler evaluation could not be performed.   Electronically Signed   By: Jeb Levering M.D.   On: 09/08/2014 06:26    Review of Systems  All other systems reviewed and are negative.  Blood pressure 112/70, pulse 81, temperature 100.3 F (37.9 C), temperature source Oral, resp. rate 18, height 6' (1.829 m), weight 73.75 kg (162 lb 9.4 oz), SpO2 98 %. Physical Exam  Constitutional: He is oriented to person, place, and time. He appears well-developed and well-nourished. No distress.  Cardiovascular: Normal rate, regular rhythm and intact distal pulses.  Exam reveals no gallop and no friction rub.   No murmur heard. Respiratory: Effort normal and breath sounds normal. No respiratory distress. He has no wheezes. He has no rales. He exhibits no tenderness.  GI: Soft. Bowel sounds are normal. He exhibits distension. He exhibits no mass. There is no tenderness. There is no rebound and no guarding.  Musculoskeletal: Normal range of motion. He exhibits no edema or tenderness.   Neurological: He is alert and oriented to person, place, and time.  Skin: Skin is warm and dry. No rash noted. He is not diaphoretic. No erythema.  No pallor.  Psychiatric: His behavior is normal. Judgment and thought content normal.  Flat affect    Assessment/Plan: Cholecystitis-given his severe cirrhosis(Childs class B) and thrombocytopenia, he is not a surgical candidate.  We will proceed with a cholecystostomy tube placement.  Will check in INR.  Keep NPO.  Change cipro to rocephin since he has not gotten any doses yet.  Thank you for the consult.  Surgery will follow along.   Roniqua Kintz ANP-BC 09/09/2014, 9:31 AM

## 2014-09-09 NOTE — Progress Notes (Signed)
Received call from radiology imaging regarding NM hepatobiliary testing which showed nonvisualization of gallbladder despite MS augmentation and extending imaging. Recommending surgery consultation for presumed acute cholecystitis. Dr Gwenlyn PerkingMadera notified. Will continue to monitor.

## 2014-09-09 NOTE — Progress Notes (Addendum)
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  -per GI will need eventually EGD; as image studies suggesting portal HTN -continue PPI -alcohol cessation -monitor LFT's -follow clinical response -??use of steroids for acute alcohol hepatitis   2-alcohol abuse with withdrawal: -continue thiamine -continue folic acid -continue CIWA -cessation counseling provided  3-SIRS due to acute cholecystitis: -started on cipro -CCS consulted (not a candidate for acute cholecystectomy due to cirrhosis and thrombocytopenia) -HIDA positive for cholecystitis  -will need cholecystostomy drain  4-thrombocytopenia: continue monitoring -no signs of overt bleeding currently -Platelets in 60,000 range  5-hyponatremia: due to cirrhosis -will monitor  6-pancreatitis: due to alcohol -will keep bowel rest and supportive care -will monitor lipase trend -CT abd demonstrated no pseudocyst and normal pancreas  Code Status: Full Family Communication: daughter and mother at bedside  Disposition Plan: continue IV antibiotics; will need cholecystostomy drain (as not a good candidate for surgery)   Consultants:  PCCM  GI  CCS  IR  Procedures:  See below for x-ray reports  HIDA scan: positive for cholecystitis   Antibiotics:  cipro 5/20  HPI/Subjective: Febrile, no CP, no nausea, no vomiting. Positive jaundice. Patient abd MRI with acute cholecystis changes    Objective: Filed Vitals:   09/09/14 1220  BP: 121/67  Pulse: 78  Temp:   Resp:    No intake or output data in the 24 hours ending 09/09/14 1735 Filed Weights   09/07/14 2133 09/08/14 0500  Weight: 73.755 kg (162 lb 9.6 oz) 73.75 kg (162 lb 9.4 oz)    Exam:   General:  Febrile (T MAX103); RUQ pain, no vomiting, no nausea currently. Positive jaundice    Cardiovascular: S1 and S2, no rubs, no gallops  Respiratory: CTA bilaterally  Abdomen: soft, RUQ discomfort with deep palpation, no guarding, positive BS  Musculoskeletal: no cyanosis, no edema   Data Reviewed: Basic Metabolic Panel:  Recent Labs Lab 09/07/14 1555 09/08/14 0320 09/09/14 0344  NA 132* 132* 135  K 4.3 3.1* 4.5  CL 91* 96* 103  CO2  --  26 27  GLUCOSE 137* 108* 112*  BUN 14 7 11   CREATININE 0.60* 0.55* 0.69  CALCIUM  --  7.5* 7.3*  MG  --  1.0*  --   PHOS  --  2.7  --    Liver Function Tests:  Recent Labs Lab 09/07/14 1547 09/08/14 0320 09/09/14 0344  AST 200* 191* 174*  ALT 103* 98* 82*  ALKPHOS 217* 211* 195*  BILITOT 3.2* 2.8* 3.6*  PROT 7.0 7.1 5.6*  ALBUMIN 2.9* 2.7* 2.1*    Recent Labs Lab 09/07/14 1547 09/07/14 1826 09/08/14 0320 09/09/14 0344  LIPASE 182*  --  123* 150*  AMYLASE  --  139* 119*  --     Recent Labs Lab 09/07/14 1826  AMMONIA 31   CBC:  Recent Labs Lab 09/07/14 1547 09/07/14 1555 09/08/14 0320 09/08/14 1214 09/09/14 0344  WBC 7.1  --  6.2 6.5 5.6  NEUTROABS 4.6  --   --   --   --   HGB 13.3 14.6 13.1 12.4* 12.7*  HCT 36.6* 43.0 36.4* 34.8* 35.3*  MCV 88.8  --  89.7 89.5 90.3  PLT 91*  --  43* 40* 64*    Recent Results (from the past 240 hour(s))  Urine culture     Status: None (Preliminary result)  Collection Time: 09/07/14  4:25 PM  Result Value Ref Range Status   Specimen Description URINE, CATHETERIZED  Final   Special Requests NONE  Final   Culture   Final    Culture reincubated for better growth Performed at [facility]    Report Status PENDING  Incomplete  MRSA PCR Screening     Status: None   Collection Time: 09/07/14  9:33 PM  Result Value Ref Range Status   MRSA by PCR NEGATIVE NEGATIVE Final    Comment:        The GeneXpert MRSA Assay (FDA approved for NASAL specimens only), is one component of a comprehensive MRSA colonization surveillance program. It is  not intended to diagnose MRSA infection nor to guide or monitor treatment for MRSA infections.   Culture, blood (routine x 2)     Status: None (Preliminary result)   Collection Time: 09/08/14 11:55 AM  Result Value Ref Range Status   Specimen Description BLOOD RIGHT ANTECUBITAL  Final   Special Requests BOTTLES DRAWN AEROBIC AND ANAEROBIC 10CC 10CC  Final   Culture   Final           BLOOD CULTURE RECEIVED NO GROWTH TO DATE CULTURE WILL BE HELD FOR 5 DAYS BEFORE ISSUING A FINAL NEGATIVE REPORT Performed at [facility]    Report Status PENDING  Incomplete  Culture, blood (routine x 2)     Status: None (Preliminary result)   Collection Time: 09/08/14 12:16 PM  Result Value Ref Range Status   Specimen Description BLOOD RIGHT ARM  Final   Special Requests BOTTLES DRAWN AEROBIC AND ANAEROBIC 10CC 10CC  Final   Culture   Final           BLOOD CULTURE RECEIVED NO GROWTH TO DATE CULTURE WILL BE HELD FOR 5 DAYS BEFORE ISSUING A FINAL NEGATIVE REPORT Performed at [facility]    Report Status PENDING  Incomplete     Studies: Mr Abdomen W Wo Contrast  09/09/2014   ADDENDUM REPORT: 09/09/2014 08:42  ADDENDUM: I discussed these findings by telephone with Dr. Gwenlyn Perking at approximately [phone] hours on 09/09/2014.   Electronically Signed   By: Kennith Center M.D.   On: 09/09/2014 08:42   09/09/2014   CLINICAL DATA:  Initial encounter for three-week history of myalgia, nausea, and vomiting with flu-like symptoms. Now with acute onset of abdominal pain  EXAM: MRI ABDOMEN WITHOUT AND WITH CONTRAST  TECHNIQUE: Multiplanar multisequence MR imaging of the abdomen was performed both before and after the administration of intravenous contrast.  CONTRAST:  15mL MULTIHANCE GADOBENATE DIMEGLUMINE 529 MG/ML IV SOLN  COMPARISON:  CT scan from 09/07/2014.  FINDINGS: Lower chest: Subsegmental atelectasis seen in the right lung base above the asymmetrically elevated right hemidiaphragm.  Hepatobiliary:  Markedly deformed enter regular liver compatible with advanced cirrhosis. Areas of parenchymal and capsular retraction centrally compatible with areas of confluent hepatic fibrosis. Motion degradation on postcontrast imaging makes assessment for hepatoma suboptimal. No definite enhancing hepatoma is identified.  Gallbladder is massively dilated with lumen full of debris/hemorrhage/ sludge. Associated diffuse gallbladder wall thickening is evident. No evidence for intra or extrahepatic biliary duct dilatation.  Pancreas: No focal mass lesion. No dilatation of the main duct. No intraparenchymal cyst. No peripancreatic edema.  Spleen: No splenomegaly. No focal mass lesion.  Adrenals/Urinary Tract: No adrenal nodule or mass. No hydronephrosis or renal mass is evident.  Stomach/Bowel: Stomach is nondistended. No evidence for small bowel dilatation.  Vascular/Lymphatic: No abdominal  aortic aneurysm. The portal vein appears patent.  Other: Small volume intraperitoneal free fluid appears slightly increased in the interval.  Musculoskeletal: No evidence for suspicious marrow enhancement within the visualized bony anatomy.  IMPRESSION: 1. Markedly dilated gallbladder is positioned anterior to the cirrhotic liver. Gallbladder lumen is diffusely filled with blood/sludge/debris with associated gallbladder wall thickening. Cholecystitis is a distinct consideration. 2. Markedly irregular informed liver with areas of confluent hepatic fibrosis. There is substantial motion artifact on this study which markedly limits assessment for hepatoma. Close followup is recommended and repeat MRI after resolution of acute symptoms, when the patient is better able to cooperate with breath holding is recommended to further evaluate.  Electronically Signed: By: Kennith Center M.D. On: 09/09/2014 08:37   Nm Hepatobiliary Including Gb  09/09/2014   CLINICAL DATA:  44 year old male with cirrhosis, right upper quadrant abdominal pain x2 days of  sudden onset. Fevers and chills nausea and vomiting. Gallbladder sludge and wall thickening. Initial encounter.  EXAM: NUCLEAR MEDICINE HEPATOBILIARY IMAGING  TECHNIQUE: Sequential images of the abdomen were obtained out to 60 minutes following intravenous administration of radiopharmaceutical.  RADIOPHARMACEUTICALS:  5.0 mCi Tc-5m Choletec IV  COMPARISON:  Abdomen MRI 09/08/2014. CT Abdomen and Pelvis 09/07/2014  FINDINGS: Abnormal liver contour. Mildly delayed radiotracer uptake by the liver and clearance of the blood pool. CBD and small bowel activity by 15 minutes.  90 minutes into the exam this study was augmented with morphine. However, visualization of the gallbladder never occurred out to 45 additional minutes of imaging.  IMPRESSION: Nonvisualization of the gallbladder despite morphine augmentation and extended imaging.  Recommend surgery consultation for presumed acute cholecystitis.  These results will be called to the ordering clinician or representative by the Radiologist Assistant, and communication documented in the PACS or zVision Dashboard.   Electronically Signed   By: Odessa Fleming M.D.   On: 09/09/2014 16:46   US Abdomen Limited Ruq  09/08/2014   CLINICAL DATA:  Abdominal pain.  EXAM: US ABDOMEN LIMITED - RIGHT UPPER QUADRANT  COMPARISON:  CT 1 day prior.  FINDINGS: Gallbladder:  Not visualized.  Common bile duct:  Diameter: Not visualized.  Liver:  Technically limited exam due at bowel gas. Irregularly-shaped liver with nodular hepatic contour. Liver parenchyma is diffusely heterogeneous with question of multiple hepatic lesion. Hepatopetal blood flow is noted in the main portal vein with velocities 34 cm/second. The hepatic vasculature evaluation is limited and not clearly defined. No definite ascites in the right upper quadrant.  IMPRESSION: Markedly limited exam demonstrating irregular hepatic nodular contours, diffusely hepatic heterogeneous echotexture, and question of multiple hepatic  lesions, all suboptimally assessed. MRI of the abdomen hepatic mass protocol is recommended for further characterization. The gallbladder as well as common bile duct are not visualized. The hepatic vasculature is not well demonstrated, and complete Doppler evaluation could not be performed.   Electronically Signed   By: Rubye Oaks M.D.   On: 09/08/2014 06:26    Scheduled Meds: . cefTRIAXone (ROCEPHIN)  IV  2 g Intravenous Q24H  . folic acid  1 mg Oral Daily  . LORazepam  0-4 mg Intravenous 4 times per day  . LORazepam  0-4 mg Intravenous Q12H  . morphine      . pantoprazole  40 mg Oral Q0600  . thiamine  100 mg Intravenous Daily  . thiamine  100 mg Oral Daily   Continuous Infusions: . dextrose 5 % and 0.9 % NaCl with KCl 40 mEq/L 75 mL/hr at 09/09/14  1656    Active Problems:   Abdominal pain    Time spent: 45 minutes (> 50% of the time dedicated to face to face evaluation, discussion with family at bedside regarding plan of care and interventions)    Vassie LollMadera, Keeanna Villafranca  [facility] Pager [phone]. If 7PM-7AM, please contact night-coverage at [email or web address], password [email or web address] 09/09/2014, 5:35 PM  LOS: 2 days

## 2014-09-09 NOTE — Progress Notes (Signed)
Temp 102.4, Dr. Clearence PedSchorr notified and aware, 650 mg tylenol ordered and administered, will recheck temp in one hour

## 2014-09-10 ENCOUNTER — Encounter (HOSPITAL_COMMUNITY): Payer: Self-pay

## 2014-09-10 ENCOUNTER — Inpatient Hospital Stay (HOSPITAL_COMMUNITY): Payer: Medicaid Other

## 2014-09-10 DIAGNOSIS — B181 Chronic viral hepatitis B without delta-agent: Secondary | ICD-10-CM

## 2014-09-10 LAB — PROTIME-INR
INR: 1.57 — ABNORMAL HIGH (ref 0.00–1.49)
Prothrombin Time: 18.8 seconds — ABNORMAL HIGH (ref 11.6–15.2)

## 2014-09-10 LAB — PROTEIN, BODY FLUID: Total protein, fluid: <3 g/dL

## 2014-09-10 LAB — GLUCOSE, SEROUS FLUID: Glucose, Fluid: 107 mg/dL

## 2014-09-10 LAB — ALBUMIN, FLUID (OTHER)

## 2014-09-10 LAB — LACTATE DEHYDROGENASE, PLEURAL OR PERITONEAL FLUID: LD, Fluid: 42 U/L — ABNORMAL HIGH (ref 3–23)

## 2014-09-10 MED ORDER — FENTANYL CITRATE (PF) 100 MCG/2ML IJ SOLN
INTRAMUSCULAR | Status: DC
Start: 2014-09-10 — End: 2014-09-10
  Filled 2014-09-10: qty 2

## 2014-09-10 MED ORDER — MIDAZOLAM HCL 2 MG/2ML IJ SOLN
INTRAMUSCULAR | Status: AC
  Filled 2014-09-10: qty 2

## 2014-09-10 MED ORDER — LIDOCAINE HCL 1 % IJ SOLN
INTRAMUSCULAR | Status: AC
  Administered 2014-09-10: 14:00:00
  Filled 2014-09-10: qty 20

## 2014-09-10 MED ORDER — SODIUM CHLORIDE 0.9 % IV SOLN
400.0000 mg | Freq: Once | INTRAVENOUS | Status: AC
  Administered 2014-09-10: 400 mg via INTRAVENOUS
  Filled 2014-09-10: qty 4

## 2014-09-10 NOTE — Sedation Documentation (Signed)
Procedure canceled by Dr Lowella DandyHenn

## 2014-09-10 NOTE — Progress Notes (Signed)
CCS/[facility]illermo P[facility] Progress Note    Subjective: Patient having chills and diaphoresis.  No abdominal pain, but no other source for his fever besides his gallbladder.  Objective: Vital signs in last 24 hours: Temp:  [98.3 F (36.8 C)-102.3 F (39.1 C)] 99.8 F (37.7 C) (05/21 0600) Pulse Rate:  [78-81] 81 (05/21 0525) Resp:  [18] 18 (05/21 0525) BP: (117-124)/(67-78) 124/77 mmHg (05/21 0525) SpO2:  [96 %] 96 % (05/21 0525) Last BM Date: 09/09/14  Intake/Output from previous day:   Intake/Output this shift:    General: No acute distress. Fevers  Lungs: Clear  Abd: Mildly distended, some bowel sounds.  Not tender and without palpable mass in the RUQ  Extremities: No DVT  Neuro: Intact  Lab Results:  (wbc:2,hgb:2,hct:2,plt:2) BMET  Recent Labs  09/08/14 0320 09/09/14 0344  NA 132* 135  K 3.1* 4.5  CL 96* 103  CO2 26 27  GLUCOSE 108* 112*  BUN 7 11  CREATININE 0.55* 0.69  CALCIUM 7.5* 7.3*   PT/INR  Recent Labs  09/07/14 1801 09/09/14 1108  LABPROT 16.8* 20.3*  INR 1.35 1.74*   ABG No results for input(s): PHART, HCO3 in the last 72 hours.  Invalid input(s): PCO2, PO2  Studies/Results: Mr Abdomen Vivien Rossetti Contrast  09/09/2014   ADDENDUM REPORT: 09/09/2014 08:42  ADDENDUM: I discussed these findings by telephone with Dr. Gwenlyn Perking at approximately [phone] on 09/09/2014.   Electronically Signed   By: Kennith Center M.D.   On: 09/09/2014 08:42   09/09/2014   CLINICAL DATA:  Initial encounter for three-week history of myalgia, nausea, and vomiting with flu-like symptoms. Now with acute onset of abdominal pain  EXAM: MRI ABDOMEN WITHOUT AND WITH CONTRAST  TECHNIQUE: Multiplanar multisequence MR imaging of the abdomen was performed both before and after the administration of intravenous contrast.  CONTRAST:  15mL MULTIHANCE GADOBENATE DIMEGLUMINE 529 MG/ML IV SOLN  COMPARISON:  CT scan from 09/07/2014.  FINDINGS: Lower chest: Subsegmental atelectasis seen in the  right lung base above the asymmetrically elevated right hemidiaphragm.  Hepatobiliary: Markedly deformed enter regular liver compatible with advanced cirrhosis. Areas of parenchymal and capsular retraction centrally compatible with areas of confluent hepatic fibrosis. Motion degradation on postcontrast imaging makes assessment for hepatoma suboptimal. No definite enhancing hepatoma is identified.  Gallbladder is massively dilated with lumen full of debris/hemorrhage/ sludge. Associated diffuse gallbladder wall thickening is evident. No evidence for intra or extrahepatic biliary duct dilatation.  Pancreas: No focal mass lesion. No dilatation of the main duct. No intraparenchymal cyst. No peripancreatic edema.  Spleen: No splenomegaly. No focal mass lesion.  Adrenals/Urinary Tract: No adrenal nodule or mass. No hydronephrosis or renal mass is evident.  Stomach/Bowel: Stomach is nondistended. No evidence for small bowel dilatation.  Vascular/Lymphatic: No abdominal aortic aneurysm. The portal vein appears patent.  Other: Small volume intraperitoneal free fluid appears slightly increased in the interval.  Musculoskeletal: No evidence for suspicious marrow enhancement within the visualized bony anatomy.  IMPRESSION: 1. Markedly dilated gallbladder is positioned anterior to the cirrhotic liver. Gallbladder lumen is diffusely filled with blood/sludge/debris with associated gallbladder wall thickening. Cholecystitis is a distinct consideration. 2. Markedly irregular informed liver with areas of confluent hepatic fibrosis. There is substantial motion artifact on this study which markedly limits assessment for hepatoma. Close followup is recommended and repeat MRI after resolution of acute symptoms, when the patient is better able to cooperate with breath holding is recommended to further evaluate.  Electronically Signed: By: Kennith Center M.D. On: 09/09/2014  08:37   Nm Hepatobiliary Including Gb  09/09/2014   CLINICAL  DATA:  44 year old male with cirrhosis, right upper quadrant abdominal pain x2 days of sudden onset. Fevers and chills nausea and vomiting. Gallbladder sludge and wall thickening. Initial encounter.  EXAM: NUCLEAR MEDICINE HEPATOBILIARY IMAGING  TECHNIQUE: Sequential images of the abdomen were obtained out to 60 minutes following intravenous administration of radiopharmaceutical.  RADIOPHARMACEUTICALS:  5.0 mCi Tc-1350m Choletec IV  COMPARISON:  Abdomen MRI 09/08/2014. CT Abdomen and Pelvis 09/07/2014  FINDINGS: Abnormal liver contour. Mildly delayed radiotracer uptake by the liver and clearance of the blood pool. CBD and small bowel activity by 15 minutes.  90 minutes into the exam this study was augmented with morphine. However, visualization of the gallbladder never occurred out to 45 additional minutes of imaging.  IMPRESSION: Nonvisualization of the gallbladder despite morphine augmentation and extended imaging.  Recommend surgery consultation for presumed acute cholecystitis.  These results will be called to the ordering clinician or representative by the Radiologist Assistant, and communication documented in the PACS or zVision Dashboard.   Electronically Signed   By: Odessa FlemingH  Hall M.D.   On: 09/09/2014 16:46    Anti-infectives: Anti-infectives    Start     Dose/Rate Route Frequency Ordered Stop   09/09/14 1700  cefTRIAXone (ROCEPHIN) 2 g in dextrose 5 % 50 mL IVPB - Premix    Comments:  Pharmacy may adjust dosing strength / duration / interval for maximal efficacy   2 g 100 mL/hr over 30 Minutes Intravenous Every 24 hours 09/09/14 1024     09/09/14 1000  ciprofloxacin (CIPRO) IVPB 400 mg  Status:  Discontinued     400 mg 200 mL/hr over 60 Minutes Intravenous Every 12 hours 09/09/14 0842 09/09/14 1024   09/08/14 1700  cefTRIAXone (ROCEPHIN) 1 g in dextrose 5 % 50 mL IVPB  Status:  Discontinued     1 g 100 mL/hr over 30 Minutes Intravenous Every 24 hours 09/07/14 2118 09/08/14 1045   09/07/14 1715   cefTRIAXone (ROCEPHIN) 1 g in dextrose 5 % 50 mL IVPB     1 g 100 mL/hr over 30 Minutes Intravenous  Once 09/07/14 1708 09/07/14 1752      Assessment/Plan: s/p  Needs percutaneous GB drain as originally determined by primary surgeon. Not a good candidate for cholecystectomy, but this would have to be considered by surgeon after discharge.  Cystic duct is obstructed.   LOS: 3 days   Marta LamasJames O. Gae BonWyatt, III, MD, FACS [phone]--pager [phone]--[facility] 09/10/2014

## 2014-09-10 NOTE — Consult Note (Signed)
Chief Complaint: Chief Complaint  Patient presents with  . Abdominal Pain  . Emesis    Referring Physician(s): CCS  History of Present Illness: Blake Lambert is a 44 y.o. male   4 days hx now of abd pain; N/V Weakness US/MRI and NM study all show mildly distended gallbladder Sludge; inflammation NM study shows no visualization Spiked temp of 102.3 this am Wbc wnl abd pain some better today Request for cholecystostomy drain placement Imaging has been reviewed by Dr Lowella Dandy He has seen and examined pt Discussed with Dr Lindie Spruce Now scheduled for procedure in IR INR 1.7 5/20---recheck stat plt 64  Past Medical History  Diagnosis Date  . Shoulder injury   . Cirrhosis of liver 08/2014.  . Liver mass, right lobe 08/2014.  Marland Kitchen Alcoholism 08/2014.    Past Surgical History  Procedure Laterality Date  . Shoulder surgery      L shoulder  . Wrist surgery Bilateral     Following [facility].    Allergies: Review of patient's allergies indicates no known allergies.  Medications: Prior to Admission medications   Medication Sig Start Date End Date Taking? Authorizing Provider  Aspirin-Acetaminophen-Caffeine (GOODYS EXTRA STRENGTH PO) Take 1 Package by mouth daily as needed (pain, headache).   Yes Historical Provider, MD     Family History  Problem Relation Age of Onset  . Cancer Mother     unkown type of CA    History   Social History  . Marital Status: Single    Spouse Name: N/A  . Number of Children: N/A  . Years of Education: N/A   Social History Main Topics  . Smoking status: Current Every Day Smoker -- 1.00 packs/day    Types: Cigarettes  . Smokeless tobacco: Not on file     Comment: 1/2 ppd  . Alcohol Use: 3.6 oz/week    6 Cans of beer per week     Comment: 12-15beers per night--12oz  . Drug Use: Yes     Comment: occasional marijuana  . Sexual Activity: Not on file   Other Topics Concern  . None   Social History Narrative     Review of Systems: A 12  point ROS discussed and pertinent positives are indicated in the HPI above.  All other systems are negative.  Review of Systems  Constitutional: Positive for fever, chills, diaphoresis, activity change and appetite change.  Respiratory: Negative for cough and shortness of breath.   Gastrointestinal: Positive for nausea. Negative for abdominal pain and blood in stool.  Musculoskeletal: Negative for back pain.  Neurological: Positive for weakness.  Psychiatric/Behavioral: Negative for behavioral problems and confusion.    Vital Signs: BP 124/77 mmHg  Pulse 81  Temp(Src) 99 F (37.2 C) (Oral)  Resp 18  Ht 6' (1.829 m)  Wt 73.75 kg (162 lb 9.4 oz)  BMI 22.05 kg/m2  SpO2 96%  Physical Exam  Constitutional: He is oriented to person, place, and time. He appears well-nourished.  Cardiovascular: Normal rate, regular rhythm and normal heart sounds.   No murmur heard. Pulmonary/Chest: Effort normal and breath sounds normal. He has no wheezes.  Abdominal: Soft. Bowel sounds are normal. He exhibits no distension.  Musculoskeletal: Normal range of motion.  Neurological: He is alert and oriented to person, place, and time.  Skin: Skin is warm and dry.  Psychiatric: He has a normal mood and affect. His behavior is normal. Judgment and thought content normal.  Nursing note and vitals reviewed.   Mallampati Score:  MD Evaluation Airway: WNL Heart: WNL Abdomen: WNL Chest/ Lungs: WNL ASA  Classification: 3  Imaging: Mr Abdomen Vivien Rossetti Contrast  09/09/2014   ADDENDUM REPORT: 09/09/2014 08:42  ADDENDUM: I discussed these findings by telephone with Dr. Gwenlyn Perking at approximately [phone] on 09/09/2014.   Electronically Signed   By: Kennith Center M.D.   On: 09/09/2014 08:42   09/09/2014   CLINICAL DATA:  Initial encounter for three-week history of myalgia, nausea, and vomiting with flu-like symptoms. Now with acute onset of abdominal pain  EXAM: MRI ABDOMEN WITHOUT AND WITH CONTRAST  TECHNIQUE:  Multiplanar multisequence MR imaging of the abdomen was performed both before and after the administration of intravenous contrast.  CONTRAST:  15mL MULTIHANCE GADOBENATE DIMEGLUMINE 529 MG/ML IV SOLN  COMPARISON:  CT scan from 09/07/2014.  FINDINGS: Lower chest: Subsegmental atelectasis seen in the right lung base above the asymmetrically elevated right hemidiaphragm.  Hepatobiliary: Markedly deformed enter regular liver compatible with advanced cirrhosis. Areas of parenchymal and capsular retraction centrally compatible with areas of confluent hepatic fibrosis. Motion degradation on postcontrast imaging makes assessment for hepatoma suboptimal. No definite enhancing hepatoma is identified.  Gallbladder is massively dilated with lumen full of debris/hemorrhage/ sludge. Associated diffuse gallbladder wall thickening is evident. No evidence for intra or extrahepatic biliary duct dilatation.  Pancreas: No focal mass lesion. No dilatation of the main duct. No intraparenchymal cyst. No peripancreatic edema.  Spleen: No splenomegaly. No focal mass lesion.  Adrenals/Urinary Tract: No adrenal nodule or mass. No hydronephrosis or renal mass is evident.  Stomach/Bowel: Stomach is nondistended. No evidence for small bowel dilatation.  Vascular/Lymphatic: No abdominal aortic aneurysm. The portal vein appears patent.  Other: Small volume intraperitoneal free fluid appears slightly increased in the interval.  Musculoskeletal: No evidence for suspicious marrow enhancement within the visualized bony anatomy.  IMPRESSION: 1. Markedly dilated gallbladder is positioned anterior to the cirrhotic liver. Gallbladder lumen is diffusely filled with blood/sludge/debris with associated gallbladder wall thickening. Cholecystitis is a distinct consideration. 2. Markedly irregular informed liver with areas of confluent hepatic fibrosis. There is substantial motion artifact on this study which markedly limits assessment for hepatoma. Close  followup is recommended and repeat MRI after resolution of acute symptoms, when the patient is better able to cooperate with breath holding is recommended to further evaluate.  Electronically Signed: By: Kennith Center M.D. On: 09/09/2014 08:37   Nm Hepatobiliary Including Gb  09/09/2014   CLINICAL DATA:  44 year old male with cirrhosis, right upper quadrant abdominal pain x2 days of sudden onset. Fevers and chills nausea and vomiting. Gallbladder sludge and wall thickening. Initial encounter.  EXAM: NUCLEAR MEDICINE HEPATOBILIARY IMAGING  TECHNIQUE: Sequential images of the abdomen were obtained out to 60 minutes following intravenous administration of radiopharmaceutical.  RADIOPHARMACEUTICALS:  5.0 mCi Tc-43m Choletec IV  COMPARISON:  Abdomen MRI 09/08/2014. CT Abdomen and Pelvis 09/07/2014  FINDINGS: Abnormal liver contour. Mildly delayed radiotracer uptake by the liver and clearance of the blood pool. CBD and small bowel activity by 15 minutes.  90 minutes into the exam this study was augmented with morphine. However, visualization of the gallbladder never occurred out to 45 additional minutes of imaging.  IMPRESSION: Nonvisualization of the gallbladder despite morphine augmentation and extended imaging.  Recommend surgery consultation for presumed acute cholecystitis.  These results will be called to the ordering clinician or representative by the Radiologist Assistant, and communication documented in the PACS or zVision Dashboard.   Electronically Signed   By: Althea Grimmer.D.  On: 09/09/2014 16:46   Ct Abdomen Pelvis W Contrast  09/07/2014   CLINICAL DATA:  Acute right upper quadrant abdominal pain.  EXAM: CT ABDOMEN AND PELVIS WITH CONTRAST  TECHNIQUE: Multidetector CT imaging of the abdomen and pelvis was performed using the standard protocol following bolus administration of intravenous contrast.  CONTRAST:  100mL OMNIPAQUE IOHEXOL 300 MG/ML  SOLN  COMPARISON:  None.  FINDINGS: Mild multilevel  degenerative disc disease is noted in the lumbar spine. Visualized lung bases appear normal.  The spleen and pancreas appear normal. Gallbladder is not clearly identified, but no definite stones are noted. The liver is severely abnormal in shape and density with enlargement of the caudate lobe in severe small size of the right hepatic lobe, most consistent with severe hepatic cirrhosis. There is abnormal ill-defined density seen throughout the superior portion of the right hepatic lobe concerning for possible neoplasm. Adrenal glands and kidneys appear normal. Enlarged collateral veins are noted suggesting portal hypertension. There is no evidence of bowel obstruction. No abnormal fluid collection is noted. Urinary bladder appears normal. Abdominal aorta appears normal.  IMPRESSION: Findings are most consistent with severe hepatic cirrhosis with collateral veins suggesting portal hypertension. Large area of abnormal irregular density is seen involving the superior portion of the right hepatic lobe concerning for possible neoplasm such as hepatocellular carcinoma. MRI scan is recommended for further evaluation.   Electronically Signed   By: Lupita RaiderJames  Green Jr, M.D.   On: 09/07/2014 17:28   Dg Abd Acute W/chest  09/07/2014   CLINICAL DATA:  Flu-like symptoms for 3 weeks  EXAM: DG ABDOMEN ACUTE W/ 1V CHEST  COMPARISON:  07/07/14  FINDINGS: There is no evidence of dilated bowel loops or free intraperitoneal air. No radiopaque calculi or other significant radiographic abnormality is seen. Heart size and mediastinal contours are within normal limits. Both lungs are clear. Postsurgical changes are noted in the left clavicle  IMPRESSION: No acute abnormality noted.   Electronically Signed   By: Alcide CleverMark  Lukens M.D.   On: 09/07/2014 16:23   [place] Abdomen Limited Ruq  09/08/2014   CLINICAL DATA:  Abdominal pain.  EXAM: US ABDOMEN LIMITED - RIGHT UPPER QUADRANT  COMPARISON:  CT 1 day prior.  FINDINGS: Gallbladder:  Not visualized.   Common bile duct:  Diameter: Not visualized.  Liver:  Technically limited exam due at bowel gas. Irregularly-shaped liver with nodular hepatic contour. Liver parenchyma is diffusely heterogeneous with question of multiple hepatic lesion. Hepatopetal blood flow is noted in the main portal vein with velocities 34 cm/second. The hepatic vasculature evaluation is limited and not clearly defined. No definite ascites in the right upper quadrant.  IMPRESSION: Markedly limited exam demonstrating irregular hepatic nodular contours, diffusely hepatic heterogeneous echotexture, and question of multiple hepatic lesions, all suboptimally assessed. MRI of the abdomen hepatic mass protocol is recommended for further characterization. The gallbladder as well as common bile duct are not visualized. The hepatic vasculature is not well demonstrated, and complete Doppler evaluation could not be performed.   Electronically Signed   By: Rubye OaksMelanie  Ehinger M.D.   On: 09/08/2014 06:26    Labs:  CBC:  Recent Labs  09/07/14 1547 09/07/14 1555 09/08/14 0320 09/08/14 1214 09/09/14 0344  WBC 7.1  --  6.2 6.5 5.6  HGB 13.3 14.6 13.1 12.4* 12.7*  HCT 36.6* 43.0 36.4* 34.8* 35.3*  PLT 91*  --  43* 40* 64*    COAGS:  Recent Labs  09/07/14 1801 09/09/14 1108  INR 1.35 1.74*  BMP:  Recent Labs  07/07/14 1218 09/07/14 1555 09/08/14 0320 09/09/14 0344  NA 139 132* 132* 135  K 3.5 4.3 3.1* 4.5  CL 106 91* 96* 103  CO2 26  --  26 27  GLUCOSE 103* 137* 108* 112*  BUN CALCIUM 7.7*  --  7.5* 7.3*  CREATININE 0.58 0.60* 0.55* 0.69  GFRNONAA >90  --  >60 >60  GFRAA >90  --  >60 >60    LIVER FUNCTION TESTS:  Recent Labs  09/07/14 1547 09/08/14 0320 09/09/14 0344  BILITOT 3.2* 2.8* 3.6*  AST 200* 191* 174*  ALT 103* 98* 82*  ALKPHOS 217* 211* 195*  PROT 7.0 7.1 5.6*  ALBUMIN 2.9* 2.7* 2.1*    TUMOR MARKERS:  Recent Labs  09/08/14 1214  AFPTM 5.6    Assessment and Plan:  Abd  pain -- now some better N/V Chills Spiked temp to 102 this am US/MRI/NM all reveal distended GB; no visualization on NM study Now scheduled for percutaneous cholecystostomy drain placement Risks and Benefits discussed with the patient including, but not limited to bleeding, infection, gallbladder perforation, bile leak, sepsis or even death. All of the patient's questions were answered, patient is agreeable to proceed. Consent signed and in chart.   Thank you for this interesting consult.  I greatly enjoyed meeting Donnell Wion and look forward to participating in their care.  Signed: Miquela Costabile A 09/10/2014, 9:08 AM   I spent a total of 40 Minutes    in face to face in clinical consultation, greater than 50% of which was counseling/coordinating care for perc chole drain

## 2014-09-10 NOTE — Progress Notes (Addendum)
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  -per GI will need eventually EGD; as image studies suggesting portal HTN -continue PPI -alcohol cessation -monitor LFT's -follow clinical response -discussed with GI and no need for prednisone at this point -will follow rec's -no ascites  -INR1.57   2-alcohol abuse with withdrawal: -continue thiamine -continue folic acid -continue CIWA -cessation counseling provided  3-SIRS due to acute cholecystitis: -continue on cipro -CCS consulted (not a candidate for acute cholecystectomy due to cirrhosis and thrombocytopenia) -HIDA positive for cholecystitis and with concerns of obstructed cystic duct -will need percutaneous cholecystostomy drain; planned to be place by IR on 5/21  4-thrombocytopenia: continue monitoring -no signs of overt bleeding currently -Platelets in 60,000 range  5-hyponatremia: due to cirrhosis -has improved after initial supportive care; last level 135 -will monitor  6-pancreatitis: due to alcohol -will continue bowel rest and supportive care -will monitor lipase trend -CT abd demonstrated no pseudocyst and normal pancreas -CMET in am  7-hepatitis B surface antigen positive: neg IMG; suggesting chronic hepatitis; unsure of activity  -will check Hep DNA, Hep B E antigen and antibody -will follow GI rec's  Code Status: Full Family Communication: daughter and mother at bedside  Disposition Plan: continue IV antibiotics; will need cholecystostomy drain (as not a good candidate for surgery)   Consultants:  PCCM  GI  CCS  IR  Procedures:  See below for x-ray reports  HIDA scan: positive for cholecystitis   Antibiotics:  cipro 5/20  HPI/Subjective: No CP, no SOB. Reports no abd pain. Patient continue spiking fever and was nauseated this  morning.  Objective: Filed Vitals:   09/10/14 0901  BP:   Pulse:   Temp: 99 F (37.2 C)  Resp:    No intake or output data in the 24 hours ending 09/10/14 1119 Filed Weights   09/07/14 2133 09/08/14 0500  Weight: 73.755 kg (162 lb 9.6 oz) 73.75 kg (162 lb 9.4 oz)    Exam:   General:  Still febrile, (T MAX 101 in last 16 hours); denies abd pain, no vomiting, some nausea currently. Less jaundice   Cardiovascular: S1 and S2, no rubs, no gallops  Respiratory: CTA bilaterally  Abdomen: soft, NT, no guarding, positive BS  Musculoskeletal: no cyanosis, no edema   Data Reviewed: Basic Metabolic Panel:  Recent Labs Lab 09/07/14 1555 09/08/14 0320 09/09/14 0344  NA 132* 132* 135  K 4.3 3.1* 4.5  CL 91* 96* 103  CO2  --  26 27  GLUCOSE 137* 108* 112*  BUN CREATININE 0.60* 0.55* 0.69  CALCIUM  --  7.5* 7.3*  MG  --  1.0*  --   PHOS  --  2.7  --    Liver Function Tests:  Recent Labs Lab 09/07/14 1547 09/08/14 0320 09/09/14 0344  AST 200* 191* 174*  ALT 103* 98* 82*  ALKPHOS 217* 211* 195*  BILITOT 3.2* 2.8* 3.6*  PROT 7.0 7.1 5.6*  ALBUMIN 2.9* 2.7* 2.1*    Recent Labs Lab 09/07/14 1547 09/07/14 1826 09/08/14 0320 09/09/14 0344  LIPASE 182*  --  123* 150*  AMYLASE  --  139* 119*  --     Recent Labs Lab 09/07/14 1826  AMMONIA 31   CBC:  Recent Labs Lab 09/07/14 1547 09/07/14 1555 09/08/14 0320 09/08/14 1214 09/09/14 0344  WBC 7.1  --  6.2 6.5 5.6  NEUTROABS 4.6  --   --   --   --   HGB 13.3 14.6 13.1 12.4* 12.7*  HCT 36.6* 43.0 36.4* 34.8* 35.3*  MCV 88.8  --  89.7 89.5 90.3  PLT 91*  --  43* 40* 64*    Recent Results (from the past 240 hour(s))  Urine culture     Status: None   Collection Time: 09/07/14  4:25 PM  Result Value Ref Range Status   Specimen Description URINE, CATHETERIZED  Final   Special Requests NONE  Final   Colony Count   Final    20,OOO COLONIES/ML Performed at [facility]    Culture    Final    Multiple bacterial morphotypes present, none predominant. Suggest appropriate recollection if clinically indicated. Performed at [facility]    Report Status 09/09/2014 FINAL  Final  MRSA PCR Screening     Status: None   Collection Time: 09/07/14  9:33 PM  Result Value Ref Range Status   MRSA by PCR NEGATIVE NEGATIVE Final    Comment:        The GeneXpert MRSA Assay (FDA approved for NASAL specimens only), is one component of a comprehensive MRSA colonization surveillance program. It is not intended to diagnose MRSA infection nor to guide or monitor treatment for MRSA infections.   Culture, blood (routine x 2)     Status: None (Preliminary result)   Collection Time: 09/08/14 11:55 AM  Result Value Ref Range Status   Specimen Description BLOOD RIGHT ANTECUBITAL  Final   Special Requests BOTTLES DRAWN AEROBIC AND ANAEROBIC 10CC 10CC  Final   Culture   Final           BLOOD CULTURE RECEIVED NO GROWTH TO DATE CULTURE WILL BE HELD FOR 5 DAYS BEFORE ISSUING A FINAL NEGATIVE REPORT Performed at [facility]    Report Status PENDING  Incomplete  Culture, blood (routine x 2)     Status: None (Preliminary result)   Collection Time: 09/08/14 12:16 PM  Result Value Ref Range Status   Specimen Description BLOOD RIGHT ARM  Final   Special Requests BOTTLES DRAWN AEROBIC AND ANAEROBIC 10CC 10CC  Final   Culture   Final           BLOOD CULTURE RECEIVED NO GROWTH TO DATE CULTURE WILL BE HELD FOR 5 DAYS BEFORE ISSUING A FINAL NEGATIVE REPORT Performed at [facility]    Report Status PENDING  Incomplete     Studies: Mr Abdomen W Wo Contrast  09/09/2014   ADDENDUM REPORT: 09/09/2014 08:42  ADDENDUM: I discussed these findings by telephone with Dr. Gwenlyn PerkingMadera at approximately [phone] hours on 09/09/2014.   Electronically Signed   By: Kennith CenterEric  Mansell M.D.   On: 09/09/2014 08:42   09/09/2014   CLINICAL DATA:  Initial encounter for three-week history of myalgia, nausea,  and vomiting with flu-like symptoms. Now with acute onset of abdominal pain  EXAM: MRI ABDOMEN WITHOUT AND WITH CONTRAST  TECHNIQUE: Multiplanar multisequence MR imaging of the abdomen was performed both before and after the administration of intravenous contrast.  CONTRAST:  15mL MULTIHANCE GADOBENATE DIMEGLUMINE 529 MG/ML IV SOLN  COMPARISON:  CT scan from 09/07/2014.  FINDINGS: Lower chest: Subsegmental atelectasis seen in the right lung base above the asymmetrically elevated right hemidiaphragm.  Hepatobiliary: Markedly deformed enter regular liver compatible with advanced cirrhosis. Areas of parenchymal and capsular retraction centrally compatible with areas of confluent hepatic fibrosis. Motion degradation  on postcontrast imaging makes assessment for hepatoma suboptimal. No definite enhancing hepatoma is identified.  Gallbladder is massively dilated with lumen full of debris/hemorrhage/ sludge. Associated diffuse gallbladder wall thickening is evident. No evidence for intra or extrahepatic biliary duct dilatation.  Pancreas: No focal mass lesion. No dilatation of the main duct. No intraparenchymal cyst. No peripancreatic edema.  Spleen: No splenomegaly. No focal mass lesion.  Adrenals/Urinary Tract: No adrenal nodule or mass. No hydronephrosis or renal mass is evident.  Stomach/Bowel: Stomach is nondistended. No evidence for small bowel dilatation.  Vascular/Lymphatic: No abdominal aortic aneurysm. The portal vein appears patent.  Other: Small volume intraperitoneal free fluid appears slightly increased in the interval.  Musculoskeletal: No evidence for suspicious marrow enhancement within the visualized bony anatomy.  IMPRESSION: 1. Markedly dilated gallbladder is positioned anterior to the cirrhotic liver. Gallbladder lumen is diffusely filled with blood/sludge/debris with associated gallbladder wall thickening. Cholecystitis is a distinct consideration. 2. Markedly irregular informed liver with areas of  confluent hepatic fibrosis. There is substantial motion artifact on this study which markedly limits assessment for hepatoma. Close followup is recommended and repeat MRI after resolution of acute symptoms, when the patient is better able to cooperate with breath holding is recommended to further evaluate.  Electronically Signed: By: Kennith Center M.D. On: 09/09/2014 08:37   Nm Hepatobiliary Including Gb  09/09/2014   CLINICAL DATA:  44 year old male with cirrhosis, right upper quadrant abdominal pain x2 days of sudden onset. Fevers and chills nausea and vomiting. Gallbladder sludge and wall thickening. Initial encounter.  EXAM: NUCLEAR MEDICINE HEPATOBILIARY IMAGING  TECHNIQUE: Sequential images of the abdomen were obtained out to 60 minutes following intravenous administration of radiopharmaceutical.  RADIOPHARMACEUTICALS:  5.0 mCi Tc-52m Choletec IV  COMPARISON:  Abdomen MRI 09/08/2014. CT Abdomen and Pelvis 09/07/2014  FINDINGS: Abnormal liver contour. Mildly delayed radiotracer uptake by the liver and clearance of the blood pool. CBD and small bowel activity by 15 minutes.  90 minutes into the exam this study was augmented with morphine. However, visualization of the gallbladder never occurred out to 45 additional minutes of imaging.  IMPRESSION: Nonvisualization of the gallbladder despite morphine augmentation and extended imaging.  Recommend surgery consultation for presumed acute cholecystitis.  These results will be called to the ordering clinician or representative by the Radiologist Assistant, and communication documented in the PACS or zVision Dashboard.   Electronically Signed   By: Odessa Fleming M.D.   On: 09/09/2014 16:46    Scheduled Meds: . cefTRIAXone (ROCEPHIN)  IV  2 g Intravenous Q24H  . folic acid  1 mg Oral Daily  . pantoprazole  40 mg Oral Q0600  . thiamine  100 mg Intravenous Daily  . thiamine  100 mg Oral Daily   Continuous Infusions: . dextrose 5 % and 0.9 % NaCl with KCl 40 mEq/L  75 mL/hr at 09/09/14 1656    Active Problems:   Abdominal pain   Cholecystitis   Transaminitis   Alcoholic cirrhosis of liver without ascites   SIRS (systemic inflammatory response syndrome)   Pancreatitis, alcoholic, acute    Time spent: 45 minutes (> 50% of the time dedicated to face to face evaluation, discussion with family at bedside regarding plan of care and interventions)    Vassie Loll  [facility] Pager [phone]. If 7PM-7AM, please contact night-coverage at [email or web address], password [facility] 09/10/2014, 11:19 AM  LOS: 3 days    Addendum  At time of placement of percutaneous drain; new development of ascites around liver and  gallbladder appreciated; reason why cholecystostomy tube was not place, as patient will have increase risk for peritonitis. Diagnostic paracentesis done. Will continue conservative treatment of cholecystitis for now.   Vassie Loll [phone]

## 2014-09-10 NOTE — Procedures (Signed)
Scheduled for percutaneous cholecystostomy tube placement.  CT images of abdomen demonstrated a large amount of ascites around the liver and gallbladder.  The cholecystostomy tube was cancelled for now because of risk for peritonitis.  US guided paracentesis was performed and 1.2 liters of clear yellow fluid was removed.  Discussed with Dr. Gwenlyn PerkingMadera and will send fluid for labs.  No immediate complication.  Recommend managing cholecystitis conservatively at this time.  Patient will be at risk for peritonitis with an indwelling cholecystostomy tube with ascites and cirrhosis.

## 2014-09-11 DIAGNOSIS — K7200 Acute and subacute hepatic failure without coma: Secondary | ICD-10-CM

## 2014-09-11 DIAGNOSIS — R188 Other ascites: Secondary | ICD-10-CM | POA: Insufficient documentation

## 2014-09-11 DIAGNOSIS — Z720 Tobacco use: Secondary | ICD-10-CM

## 2014-09-11 LAB — COMPREHENSIVE METABOLIC PANEL
ALBUMIN: 1.9 g/dL — AB (ref 3.5–5.0)
ALT: 66 U/L — AB (ref 17–63)
AST: 143 U/L — ABNORMAL HIGH (ref 15–41)
Alkaline Phosphatase: 196 U/L — ABNORMAL HIGH (ref 38–126)
Anion gap: 6 (ref 5–15)
BILIRUBIN TOTAL: 3.2 mg/dL — AB (ref 0.3–1.2)
BUN: 7 mg/dL (ref 6–20)
CALCIUM: 7 mg/dL — AB (ref 8.9–10.3)
CO2: 20 mmol/L — AB (ref 22–32)
CREATININE: 0.53 mg/dL — AB (ref 0.61–1.24)
Chloride: 108 mmol/L (ref 101–111)
GFR calc Af Amer: >60 mL/min (ref >60)
Glucose, Bld: 118 mg/dL — ABNORMAL HIGH (ref 65–99)
POTASSIUM: 3.7 mmol/L (ref 3.5–5.1)
SODIUM: 134 mmol/L — AB (ref 135–145)
TOTAL PROTEIN: 5.8 g/dL — AB (ref 6.5–8.1)

## 2014-09-11 LAB — CBC
HEMATOCRIT: 33.9 % — AB (ref 39.0–52.0)
HEMOGLOBIN: 12.1 g/dL — AB (ref 13.0–17.0)
MCH: 31.9 pg (ref 26.0–34.0)
MCHC: 35.7 g/dL (ref 30.0–36.0)
MCV: 89.4 fL (ref 78.0–100.0)
Platelets: 84 10*3/uL — ABNORMAL LOW (ref 150–400)
RBC: 3.79 MIL/uL — ABNORMAL LOW (ref 4.22–5.81)
RDW: 15.2 % (ref 11.5–15.5)
WBC: 7.8 10*3/uL (ref 4.0–10.5)

## 2014-09-11 LAB — LIPASE, BLOOD: Lipase: 184 U/L — ABNORMAL HIGH (ref 22–51)

## 2014-09-11 MED ORDER — PIPERACILLIN-TAZOBACTAM 3.375 G IVPB
3.3750 g | Freq: Three times a day (TID) | INTRAVENOUS | Status: DC
  Administered 2014-09-11 – 2014-09-13 (×7): 3.375 g via INTRAVENOUS
  Filled 2014-09-11 (×9): qty 50

## 2014-09-11 MED ORDER — MAGNESIUM SULFATE 2 GM/50ML IV SOLN
2.0000 g | Freq: Once | INTRAVENOUS | Status: AC
  Administered 2014-09-11: 2 g via INTRAVENOUS
  Filled 2014-09-11: qty 50

## 2014-09-11 MED ORDER — ONDANSETRON HCL 4 MG/2ML IJ SOLN
4.0000 mg | Freq: Four times a day (QID) | INTRAMUSCULAR | Status: DC | PRN
  Administered 2014-09-11 – 2014-09-13 (×7): 4 mg via INTRAVENOUS
  Filled 2014-09-11 (×7): qty 2

## 2014-09-11 NOTE — Progress Notes (Signed)
Referring Physician(s): CCS  Subjective:  Pt was scheduled for perc chole drain 5/21 Changed to large volume para after visualization of ascites in abd per Dr Lowella Dandy Para 1.2 liters removed Could not safely place chole drain - with now high risk for peritonitis Pt has abd soreness diffusely- but no other complaints  Allergies: Review of patient's allergies indicates no known allergies.  Medications: Prior to Admission medications   Medication Sig Start Date End Date Taking? Authorizing Provider  Aspirin-Acetaminophen-Caffeine (GOODYS EXTRA STRENGTH PO) Take 1 Package by mouth daily as needed (pain, headache).   Yes Historical Provider, MD     Vital Signs: BP 122/69 mmHg  Pulse 74  Temp(Src) 97.5 F (36.4 C) (Oral)  Resp 18  Ht 6' (1.829 m)  Wt 73.75 kg (162 lb 9.4 oz)  BMI 22.05 kg/m2  SpO2 100%  Physical Exam  Abdominal: Soft. Bowel sounds are normal. He exhibits no distension. There is tenderness.  Diffusely sore abd para site clean and dry No RUQ pain afeb    Imaging: Ct Abdomen Wo Contrast  09/10/2014   CLINICAL DATA:  44 year old with cirrhosis and abnormal gallbladder. Concern for acute cholecystitis. Request for percutaneous cholecystostomy tube. Patient was scheduled for CT-guided cholecystostomy tube placement.  EXAM: CT ABDOMEN WITHOUT CONTRAST  TECHNIQUE: Multidetector CT imaging of the abdomen was performed following the standard protocol without IV contrast. Images through of the upper abdomen were obtained.  COMPARISON:  CT 09/07/2014 and MRI 09/08/2014  FINDINGS: Patient now has a large amount of perihepatic ascites along the right side of the liver and surrounding the gallbladder. There is high-density material in the gallbladder with a thickened wall along the medial aspect. Liver has a very unusual configuration with a markedly enlarged caudate lobe. Findings are compatible with cirrhosis. Again noted are large vessels along the anterior abdomen related  to cirrhosis and likely portal hypertension. There is a diffuse mesenteric edema. No acute abnormalities involving the on liver, adrenal glands, pancreas and visualized kidneys.  IMPRESSION: Large amount of perihepatic ascites and a large amount of fluid surrounding the gallbladder.  High-density material throughout the gallbladder. Findings could be related to blood products, sludge or even vicarious excretion of contrast from the previous CT examination. There is also marked thickening along the medial aspect of the gallbladder.  Cirrhosis.   Electronically Signed   By: Richarda Overlie M.D.   On: 09/10/2014 19:19   Mr Abdomen W ZO Contrast  09/09/2014   ADDENDUM REPORT: 09/09/2014 08:42  ADDENDUM: I discussed these findings by telephone with Dr. Gwenlyn Perking at approximately [phone] hours on 09/09/2014.   Electronically Signed   By: Kennith Center M.D.   On: 09/09/2014 08:42   09/09/2014   CLINICAL DATA:  Initial encounter for three-week history of myalgia, nausea, and vomiting with flu-like symptoms. Now with acute onset of abdominal pain  EXAM: MRI ABDOMEN WITHOUT AND WITH CONTRAST  TECHNIQUE: Multiplanar multisequence MR imaging of the abdomen was performed both before and after the administration of intravenous contrast.  CONTRAST:  15mL MULTIHANCE GADOBENATE DIMEGLUMINE 529 MG/ML IV SOLN  COMPARISON:  CT scan from 09/07/2014.  FINDINGS: Lower chest: Subsegmental atelectasis seen in the right lung base above the asymmetrically elevated right hemidiaphragm.  Hepatobiliary: Markedly deformed enter regular liver compatible with advanced cirrhosis. Areas of parenchymal and capsular retraction centrally compatible with areas of confluent hepatic fibrosis. Motion degradation on postcontrast imaging makes assessment for hepatoma suboptimal. No definite enhancing hepatoma is identified.  Gallbladder is massively  dilated with lumen full of debris/hemorrhage/ sludge. Associated diffuse gallbladder wall thickening is evident. No  evidence for intra or extrahepatic biliary duct dilatation.  Pancreas: No focal mass lesion. No dilatation of the main duct. No intraparenchymal cyst. No peripancreatic edema.  Spleen: No splenomegaly. No focal mass lesion.  Adrenals/Urinary Tract: No adrenal nodule or mass. No hydronephrosis or renal mass is evident.  Stomach/Bowel: Stomach is nondistended. No evidence for small bowel dilatation.  Vascular/Lymphatic: No abdominal aortic aneurysm. The portal vein appears patent.  Other: Small volume intraperitoneal free fluid appears slightly increased in the interval.  Musculoskeletal: No evidence for suspicious marrow enhancement within the visualized bony anatomy.  IMPRESSION: 1. Markedly dilated gallbladder is positioned anterior to the cirrhotic liver. Gallbladder lumen is diffusely filled with blood/sludge/debris with associated gallbladder wall thickening. Cholecystitis is a distinct consideration. 2. Markedly irregular informed liver with areas of confluent hepatic fibrosis. There is substantial motion artifact on this study which markedly limits assessment for hepatoma. Close followup is recommended and repeat MRI after resolution of acute symptoms, when the patient is better able to cooperate with breath holding is recommended to further evaluate.  Electronically Signed: By: Kennith CenterEric  Mansell M.D. On: 09/09/2014 08:37   Nm Hepatobiliary Including Gb  09/09/2014   CLINICAL DATA:  44 year old male with cirrhosis, right upper quadrant abdominal pain x2 days of sudden onset. Fevers and chills nausea and vomiting. Gallbladder sludge and wall thickening. Initial encounter.  EXAM: NUCLEAR MEDICINE HEPATOBILIARY IMAGING  TECHNIQUE: Sequential images of the abdomen were obtained out to 60 minutes following intravenous administration of radiopharmaceutical.  RADIOPHARMACEUTICALS:  5.0 mCi Tc-4051m Choletec IV  COMPARISON:  Abdomen MRI 09/08/2014. CT Abdomen and Pelvis 09/07/2014  FINDINGS: Abnormal liver contour.  Mildly delayed radiotracer uptake by the liver and clearance of the blood pool. CBD and small bowel activity by 15 minutes.  90 minutes into the exam this study was augmented with morphine. However, visualization of the gallbladder never occurred out to 45 additional minutes of imaging.  IMPRESSION: Nonvisualization of the gallbladder despite morphine augmentation and extended imaging.  Recommend surgery consultation for presumed acute cholecystitis.  These results will be called to the ordering clinician or representative by the Radiologist Assistant, and communication documented in the PACS or zVision Dashboard.   Electronically Signed   By: Odessa FlemingH  Hall M.D.   On: 09/09/2014 16:46   Ct Abdomen Pelvis W Contrast  09/07/2014   CLINICAL DATA:  Acute right upper quadrant abdominal pain.  EXAM: CT ABDOMEN AND PELVIS WITH CONTRAST  TECHNIQUE: Multidetector CT imaging of the abdomen and pelvis was performed using the standard protocol following bolus administration of intravenous contrast.  CONTRAST:  100mL OMNIPAQUE IOHEXOL 300 MG/ML  SOLN  COMPARISON:  None.  FINDINGS: Mild multilevel degenerative disc disease is noted in the lumbar spine. Visualized lung bases appear normal.  The spleen and pancreas appear normal. Gallbladder is not clearly identified, but no definite stones are noted. The liver is severely abnormal in shape and density with enlargement of the caudate lobe in severe small size of the right hepatic lobe, most consistent with severe hepatic cirrhosis. There is abnormal ill-defined density seen throughout the superior portion of the right hepatic lobe concerning for possible neoplasm. Adrenal glands and kidneys appear normal. Enlarged collateral veins are noted suggesting portal hypertension. There is no evidence of bowel obstruction. No abnormal fluid collection is noted. Urinary bladder appears normal. Abdominal aorta appears normal.  IMPRESSION: Findings are most consistent with severe hepatic  cirrhosis with  collateral veins suggesting portal hypertension. Large area of abnormal irregular density is seen involving the superior portion of the right hepatic lobe concerning for possible neoplasm such as hepatocellular carcinoma. MRI scan is recommended for further evaluation.   Electronically Signed   By: Lupita Raider, M.D.   On: 09/07/2014 17:28   Dg Abd Acute W/chest  09/07/2014   CLINICAL DATA:  Flu-like symptoms for 3 weeks  EXAM: DG ABDOMEN ACUTE W/ 1V CHEST  COMPARISON:  07/07/14  FINDINGS: There is no evidence of dilated bowel loops or free intraperitoneal air. No radiopaque calculi or other significant radiographic abnormality is seen. Heart size and mediastinal contours are within normal limits. Both lungs are clear. Postsurgical changes are noted in the left clavicle  IMPRESSION: No acute abnormality noted.   Electronically Signed   By: Alcide Clever M.D.   On: 09/07/2014 16:23   US Abdomen Limited Ruq  09/08/2014   CLINICAL DATA:  Abdominal pain.  EXAM: US ABDOMEN LIMITED - RIGHT UPPER QUADRANT  COMPARISON:  CT 1 day prior.  FINDINGS: Gallbladder:  Not visualized.  Common bile duct:  Diameter: Not visualized.  Liver:  Technically limited exam due at bowel gas. Irregularly-shaped liver with nodular hepatic contour. Liver parenchyma is diffusely heterogeneous with question of multiple hepatic lesion. Hepatopetal blood flow is noted in the main portal vein with velocities 34 cm/second. The hepatic vasculature evaluation is limited and not clearly defined. No definite ascites in the right upper quadrant.  IMPRESSION: Markedly limited exam demonstrating irregular hepatic nodular contours, diffusely hepatic heterogeneous echotexture, and question of multiple hepatic lesions, all suboptimally assessed. MRI of the abdomen hepatic mass protocol is recommended for further characterization. The gallbladder as well as common bile duct are not visualized. The hepatic vasculature is not well  demonstrated, and complete Doppler evaluation could not be performed.   Electronically Signed   By: Rubye Oaks M.D.   On: 09/08/2014 06:26   Ir Paracentesis  09/10/2014   CLINICAL DATA:  44 year old with abdominal pain and abnormal gallbladder. Scheduled for percutaneous cholecystostomy tube.  EXAM: IR PARACENTESIS  Physician: Rachelle Hora. Lowella Dandy, MD  FLUOROSCOPY TIME:  None  MEDICATIONS: None  ANESTHESIA/SEDATION: Moderate sedation time: None  PROCEDURE: Patient was scheduled for a CT-guided cholecystostomy tube placement. CT demonstrated a large amount of fluid around the liver and gallbladder. The cholecystostomy tube was not performed due to the large amount of ascites and risk for peritonitis. Informed consent was obtained for an ultrasound-guided paracentesis. The right side of the abdomen was prepped and draped in sterile fashion. Skin was anesthetized with 1% lidocaine. A 19 gauge Yueh catheter was directed into the perihepatic ascites with ultrasound guidance. 1.2 L of clear yellow fluid was aspirated. Bandages placed at the puncture site.  FINDINGS: Large amount of perihepatic ascites. 1.2 L of yellow fluid was removed. Sample was sent for analysis.  Estimated blood loss: Minimal  COMPLICATIONS: None  IMPRESSION: Ultrasound-guided paracentesis.  1.2 L of fluid was removed.   Electronically Signed   By: Richarda Overlie M.D.   On: 09/10/2014 19:22    Labs:  CBC:  Recent Labs  09/08/14 0320 09/08/14 1214 09/09/14 0344 09/11/14 0525  WBC 6.2 6.5 5.6 7.8  HGB 13.1 12.4* 12.7* 12.1*  HCT 36.4* 34.8* 35.3* 33.9*  PLT 43* 40* 64* 84*    COAGS:  Recent Labs  09/07/14 1801 09/09/14 1108 09/10/14 0952  INR 1.35 1.74* 1.57*    BMP:  Recent Labs  07/07/14 1218  09/07/14 1555 09/08/14 0320 09/09/14 0344 09/11/14 0525  NA 139 132* 132* 135 134*  K 3.5 4.3 3.1* 4.5 3.7  CL 106 91* 96* 103 108  CO2 26  --  26 27 20*  GLUCOSE 103* 137* 108* 112* 118*  BUN CALCIUM 7.7*  --   7.5* 7.3* 7.0*  CREATININE 0.58 0.60* 0.55* 0.69 0.53*  GFRNONAA >90  --  >60 >60 >60  GFRAA >90  --  >60 >60 >60    LIVER FUNCTION TESTS:  Recent Labs  09/07/14 1547 09/08/14 0320 09/09/14 0344 09/11/14 0525  BILITOT 3.2* 2.8* 3.6* 3.2*  AST 200* 191* 174* 143*  ALT 103* 98* 82* 66*  ALKPHOS 217* 211* 195* 196*  PROT 7.0 7.1 5.6* 5.8*  ALBUMIN 2.9* 2.7* 2.1* 1.9*    Assessment and Plan:  Post para; RUQ: 1.2 liters removed 5/21 Soreness but no other complaint Plan per CCS  Signed: Tiffny Gemmer A 09/11/2014, 9:41 AM   I spent a total of 15 Minutes in face to face in clinical consultation/evaluation, greater than 50% of which was counseling/coordinating care for paracentesis

## 2014-09-11 NOTE — Progress Notes (Signed)
Subjective: Pt had cholecystostomy tube cancelled secondary to ascitic fluid con't with some nausea  Objective: Vital signs in last 24 hours: Temp:  [97.5 F (36.4 C)-99 F (37.2 C)] 97.5 F (36.4 C) (05/22 0425) Pulse Rate:  [72-78] 74 (05/22 0425) Resp:  [17-18] 18 (05/22 0425) BP: (115-122)/(65-74) 122/69 mmHg (05/22 0425) SpO2:  [97 %-100 %] 100 % (05/22 0425) Last BM Date: 09/09/14  Intake/Output from previous day:   Intake/Output this shift:    General appearance: alert and cooperative GI: some mild ttp RUQ, no rebound/guarding  Lab Results:   Recent Labs  09/09/14 0344 09/11/14 0525  WBC 5.6 7.8  HGB 12.7* 12.1*  HCT 35.3* 33.9*  PLT 64* 84*   BMET  Recent Labs  09/09/14 0344 09/11/14 0525  NA 135 134*  K 4.5 3.7  CL 103 108  CO2 27 20*  GLUCOSE 112* 118*  BUN 11 7  CREATININE 0.69 0.53*  CALCIUM 7.3* 7.0*   PT/INR  Recent Labs  09/09/14 1108 09/10/14 0952  LABPROT 20.3* 18.8*  INR 1.74* 1.57*   ABG No results for input(s): PHART, HCO3 in the last 72 hours.  Invalid input(s): PCO2, PO2  Studies/Results: Ct Abdomen Wo Contrast  09/10/2014   CLINICAL DATA:  44 year old with cirrhosis and abnormal gallbladder. Concern for acute cholecystitis. Request for percutaneous cholecystostomy tube. Patient was scheduled for CT-guided cholecystostomy tube placement.  EXAM: CT ABDOMEN WITHOUT CONTRAST  TECHNIQUE: Multidetector CT imaging of the abdomen was performed following the standard protocol without IV contrast. Images through of the upper abdomen were obtained.  COMPARISON:  CT 09/07/2014 and MRI 09/08/2014  FINDINGS: Patient now has a large amount of perihepatic ascites along the right side of the liver and surrounding the gallbladder. There is high-density material in the gallbladder with a thickened wall along the medial aspect. Liver has a very unusual configuration with a markedly enlarged caudate lobe. Findings are compatible with cirrhosis.  Again noted are large vessels along the anterior abdomen related to cirrhosis and likely portal hypertension. There is a diffuse mesenteric edema. No acute abnormalities involving the on liver, adrenal glands, pancreas and visualized kidneys.  IMPRESSION: Large amount of perihepatic ascites and a large amount of fluid surrounding the gallbladder.  High-density material throughout the gallbladder. Findings could be related to blood products, sludge or even vicarious excretion of contrast from the previous CT examination. There is also marked thickening along the medial aspect of the gallbladder.  Cirrhosis.   Electronically Signed   By: Richarda Overlie M.D.   On: 09/10/2014 19:19   Nm Hepatobiliary Including Gb  09/09/2014   CLINICAL DATA:  44 year old male with cirrhosis, right upper quadrant abdominal pain x2 days of sudden onset. Fevers and chills nausea and vomiting. Gallbladder sludge and wall thickening. Initial encounter.  EXAM: NUCLEAR MEDICINE HEPATOBILIARY IMAGING  TECHNIQUE: Sequential images of the abdomen were obtained out to 60 minutes following intravenous administration of radiopharmaceutical.  RADIOPHARMACEUTICALS:  5.0 mCi Tc-55m Choletec IV  COMPARISON:  Abdomen MRI 09/08/2014. CT Abdomen and Pelvis 09/07/2014  FINDINGS: Abnormal liver contour. Mildly delayed radiotracer uptake by the liver and clearance of the blood pool. CBD and small bowel activity by 15 minutes.  90 minutes into the exam this study was augmented with morphine. However, visualization of the gallbladder never occurred out to 45 additional minutes of imaging.  IMPRESSION: Nonvisualization of the gallbladder despite morphine augmentation and extended imaging.  Recommend surgery consultation for presumed acute cholecystitis.  These results will be called  to the ordering clinician or representative by the Radiologist Assistant, and communication documented in the PACS or zVision Dashboard.   Electronically Signed   By: Odessa FlemingH  Hall M.D.    On: 09/09/2014 16:46   Ir Paracentesis  09/10/2014   CLINICAL DATA:  44 year old with abdominal pain and abnormal gallbladder. Scheduled for percutaneous cholecystostomy tube.  EXAM: IR PARACENTESIS  Physician: Rachelle HoraAdam R. Lowella DandyHenn, MD  FLUOROSCOPY TIME:  None  MEDICATIONS: None  ANESTHESIA/SEDATION: Moderate sedation time: None  PROCEDURE: Patient was scheduled for a CT-guided cholecystostomy tube placement. CT demonstrated a large amount of fluid around the liver and gallbladder. The cholecystostomy tube was not performed due to the large amount of ascites and risk for peritonitis. Informed consent was obtained for an ultrasound-guided paracentesis. The right side of the abdomen was prepped and draped in sterile fashion. Skin was anesthetized with 1% lidocaine. A 19 gauge Yueh catheter was directed into the perihepatic ascites with ultrasound guidance. 1.2 L of clear yellow fluid was aspirated. Bandages placed at the puncture site.  FINDINGS: Large amount of perihepatic ascites. 1.2 L of yellow fluid was removed. Sample was sent for analysis.  Estimated blood loss: Minimal  COMPLICATIONS: None  IMPRESSION: Ultrasound-guided paracentesis.  1.2 L of fluid was removed.   Electronically Signed   By: Richarda OverlieAdam  Henn M.D.   On: 09/10/2014 19:22    Anti-infectives: Anti-infectives    Start     Dose/Rate Route Frequency Ordered Stop   09/09/14 1700  cefTRIAXone (ROCEPHIN) 2 g in dextrose 5 % 50 mL IVPB - Premix    Comments:  Pharmacy may adjust dosing strength / duration / interval for maximal efficacy   2 g 100 mL/hr over 30 Minutes Intravenous Every 24 hours 09/09/14 1024     09/09/14 1000  ciprofloxacin (CIPRO) IVPB 400 mg  Status:  Discontinued     400 mg 200 mL/hr over 60 Minutes Intravenous Every 12 hours 09/09/14 0842 09/09/14 1024   09/08/14 1700  cefTRIAXone (ROCEPHIN) 1 g in dextrose 5 % 50 mL IVPB  Status:  Discontinued     1 g 100 mL/hr over 30 Minutes Intravenous Every 24 hours 09/07/14 2118 09/08/14  1045   09/07/14 1715  cefTRIAXone (ROCEPHIN) 1 g in dextrose 5 % 50 mL IVPB     1 g 100 mL/hr over 30 Minutes Intravenous  Once 09/07/14 1708 09/07/14 1752      Assessment/Plan: Cholecystitis-per IR not a good C-tube candidate 2/2 to ascities Cirrhotic disease makes him a poor surgical candidate Would recommend con't non-op tx with abx at this time.   LOS: 4 days    Marigene EhlersRamirez Jr., [facility] 09/11/2014

## 2014-09-11 NOTE — Progress Notes (Signed)
ANTIBIOTIC CONSULT NOTE - INITIAL  Pharmacy Consult for zosyn Indication: Intra-abdominal Infection  No Known Allergies  Patient Measurements: Height: 6' (182.9 cm) Weight: 162 lb 9.4 oz (73.75 kg) IBW/kg (Calculated) : 77.6  Vital Signs: Temp: 97.5 F (36.4 C) (05/22 0425) Temp Source: Oral (05/22 0425) BP: 122/69 mmHg (05/22 0425) Pulse Rate: 74 (05/22 0425) Intake/Output from previous day:   Intake/Output from this shift:    Labs:  Recent Labs  09/08/14 1214 09/09/14 0344 09/11/14 0525  WBC 6.5 5.6 7.8  HGB 12.4* 12.7* 12.1*  PLT 40* 64* 84*  CREATININE  --  0.69 0.53*   Estimated Creatinine Clearance: 124.3 mL/min (by C-G formula based on Cr of 0.53). No results for input(s): VANCOTROUGH, VANCOPEAK, VANCORANDOM, GENTTROUGH, GENTPEAK, GENTRANDOM, TOBRATROUGH, TOBRAPEAK, TOBRARND, AMIKACINPEAK, AMIKACINTROU, AMIKACIN in the last 72 hours.   Microbiology: Recent Results (from the past 720 hour(s))  Urine culture     Status: None   Collection Time: 09/07/14  4:25 PM  Result Value Ref Range Status   Specimen Description URINE, CATHETERIZED  Final   Special Requests NONE  Final   Colony Count   Final    20,OOO COLONIES/ML Performed at [facility]    Culture   Final    Multiple bacterial morphotypes present, none predominant. Suggest appropriate recollection if clinically indicated. Performed at [facility]    Report Status 09/09/2014 FINAL  Final  MRSA PCR Screening     Status: None   Collection Time: 09/07/14  9:33 PM  Result Value Ref Range Status   MRSA by PCR NEGATIVE NEGATIVE Final    Comment:        The GeneXpert MRSA Assay (FDA approved for NASAL specimens only), is one component of a comprehensive MRSA colonization surveillance program. It is not intended to diagnose MRSA infection nor to guide or monitor treatment for MRSA infections.   Culture, blood (routine x 2)     Status: None (Preliminary result)   Collection Time:  09/08/14 11:55 AM  Result Value Ref Range Status   Specimen Description BLOOD RIGHT ANTECUBITAL  Final   Special Requests BOTTLES DRAWN AEROBIC AND ANAEROBIC 10CC 10CC  Final   Culture   Final           BLOOD CULTURE RECEIVED NO GROWTH TO DATE CULTURE WILL BE HELD FOR 5 DAYS BEFORE ISSUING A FINAL NEGATIVE REPORT Performed at [facility]    Report Status PENDING  Incomplete  Culture, blood (routine x 2)     Status: None (Preliminary result)   Collection Time: 09/08/14 12:16 PM  Result Value Ref Range Status   Specimen Description BLOOD RIGHT ARM  Final   Special Requests BOTTLES DRAWN AEROBIC AND ANAEROBIC 10CC 10CC  Final   Culture   Final           BLOOD CULTURE RECEIVED NO GROWTH TO DATE CULTURE WILL BE HELD FOR 5 DAYS BEFORE ISSUING A FINAL NEGATIVE REPORT Performed at [facility]    Report Status PENDING  Incomplete  Body fluid culture     Status: None (Preliminary result)   Collection Time: 09/10/14  4:29 PM  Result Value Ref Range Status   Specimen Description PERITONEAL FLUID  Final   Special Requests NONE  Final   Gram Stain   Final    Cytospin slide WBC PRESENT,BOTH PMN AND MONONUCLEAR NO ORGANISMS SEEN Performed at [facility]    Culture NO GROWTH Performed at [facility]  Final   Report Status PENDING  Incomplete    Medical History: Past Medical History  Diagnosis Date  . Shoulder injury   . Cirrhosis of liver 08/2014.  . Liver mass, right lobe 08/2014.  Marland Kitchen Alcoholism 08/2014.    Assessment: [name] with cirrhosis d/t alcoholic hepatitis and SIRS d/t acute cholecystitis, on rocephin previously, cholecystostomy tube placement cancelled d/t ascites. Not a good candidate for surgical interventions per surgery. Will broaden antibiotic to zosyn. His renal function is good, est. crcl > 100 ml/min.  Zosyn 5/22 >> Rocephin 5/20>>5/21  5/19 blood x 2 - ngtd 5/21 peritoneal fluid - ngtd  Plan:  - Zosyn 3.375g IV Q 8 hrs (4 hr  infusion) - f/u cultures  Bayard Hugger, PharmD, BCPS  Clinical Pharmacist  Pager: [phone]   09/11/2014,10:58 AM

## 2014-09-11 NOTE — Progress Notes (Signed)
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  -per GI will need eventually EGD; as image studies suggesting portal HTN -continue PPI -alcohol cessation -monitor LFT's -follow clinical response -discussed with GI and no need for prednisone at this point -will follow rec's -no ascites today; but he had 1.2L removed fluid around liver and gallbladder -last INR1.57   2-alcohol abuse with withdrawal: -continue thiamine -continue folic acid -continue CIWA -cessation counseling provided  3-SIRS due to acute cholecystitis: -continue antibiotics (now on zosyn) -CCS consulted (not a candidate for acute cholecystectomy due to cirrhosis and thrombocytopenia) -HIDA positive for cholecystitis and with concerns of obstructed cystic duct -on zosyn now with non-operative management of cholecystitis  -platelets improving and also improvement in his LFT's -patient with high risk for peritonitis given ascites if Percutaneous drain is placed. 4-thrombocytopenia: continue monitoring -no signs of overt bleeding currently -Platelets in 84,000 range  5-hyponatremia: due to cirrhosis -has improved/stable now -will monitor  6-pancreatitis: due to alcohol -will continue bowel rest and supportive care -will monitor lipase trend -CT abd demonstrated no pseudocyst and normal pancreas -CMET in am  7-hepatitis B surface antigen positive: neg IMG; suggesting chronic hepatitis; unsure of activity  -will check Hep DNA, Hep B E antigen and antibody -will follow GI rec's  Code Status: Full Family Communication: daughter and mother at bedside  Disposition Plan: continue IV antibiotics; will need cholecystostomy drain (as not a good candidate for surgery)   Consultants:  PCCM  GI  CCS  IR  Procedures:  See below for x-ray  reports  HIDA scan: positive for cholecystitis   Paracentesis 5/21: 1.2 L  Antibiotics:  cipro 5/20>>5/22  Zosyn 5/22  HPI/Subjective: No CP, no SOB. Reports abd pain in area where paracentesis done. Complaining of some nausea. No fever  Objective: Filed Vitals:   09/11/14 0425  BP: 122/69  Pulse: 74  Temp: 97.5 F (36.4 C)  Resp: 18    Intake/Output Summary (Last 24 hours) at 09/11/14 1416 Last data filed at 09/11/14 0425  Gross per 24 hour  Intake      0 ml  Output      0 ml  Net      0 ml   Filed Weights   09/07/14 2133 09/08/14 0500  Weight: 73.755 kg (162 lb 9.6 oz) 73.75 kg (162 lb 9.4 oz)    Exam:   General:  No fever; still complaining of nausea and sore in his abdomen after paracentesis on 5/21. Less jaundice   Cardiovascular: S1 and S2, no rubs, no gallops  Respiratory: CTA bilaterally  Abdomen: soft, NT, no guarding, positive BS  Musculoskeletal: no cyanosis, no edema   Data Reviewed: Basic Metabolic Panel:  Recent Labs Lab 09/07/14 1555 09/08/14 0320 09/09/14 0344 09/11/14 0525  NA 132* 132* 135 134*  K 4.3 3.1* 4.5 3.7  CL 91* 96* 103 108  CO2  --  26 27 20*  GLUCOSE 137* 108* 112* 118*  BUN CREATININE 0.60* 0.55* 0.69 0.53*  CALCIUM  --  7.5* 7.3* 7.0*  MG  --  1.0*  --   --   PHOS  --  2.7  --   --    Liver Function Tests:  Recent Labs Lab 09/07/14 1547 09/08/14 0320 09/09/14 0344 09/11/14 0525  AST 200* 191* 174* 143*  ALT 103* 98* 82*  66*  ALKPHOS 217* 211* 195* 196*  BILITOT 3.2* 2.8* 3.6* 3.2*  PROT 7.0 7.1 5.6* 5.8*  ALBUMIN 2.9* 2.7* 2.1* 1.9*    Recent Labs Lab 09/07/14 1547 09/07/14 1826 09/08/14 0320 09/09/14 0344 09/11/14 0525  LIPASE 182*  --  123* 150* 184*  AMYLASE  --  139* 119*  --   --     Recent Labs Lab 09/07/14 1826  AMMONIA 31   CBC:  Recent Labs Lab 09/07/14 1547 09/07/14 1555 09/08/14 0320 09/08/14 1214 09/09/14 0344 09/11/14 0525  WBC 7.1  --  6.2 6.5 5.6  7.8  NEUTROABS 4.6  --   --   --   --   --   HGB 13.3 14.6 13.1 12.4* 12.7* 12.1*  HCT 36.6* 43.0 36.4* 34.8* 35.3* 33.9*  MCV 88.8  --  89.7 89.5 90.3 89.4  PLT 91*  --  43* 40* 64* 84*    Recent Results (from the past 240 hour(s))  Urine culture     Status: None   Collection Time: 09/07/14  4:25 PM  Result Value Ref Range Status   Specimen Description URINE, CATHETERIZED  Final   Special Requests NONE  Final   Colony Count   Final    20,OOO COLONIES/ML Performed at [facility]    Culture   Final    Multiple bacterial morphotypes present, none predominant. Suggest appropriate recollection if clinically indicated. Performed at [facility]    Report Status 09/09/2014 FINAL  Final  MRSA PCR Screening     Status: None   Collection Time: 09/07/14  9:33 PM  Result Value Ref Range Status   MRSA by PCR NEGATIVE NEGATIVE Final    Comment:        The GeneXpert MRSA Assay (FDA approved for NASAL specimens only), is one component of a comprehensive MRSA colonization surveillance program. It is not intended to diagnose MRSA infection nor to guide or monitor treatment for MRSA infections.   Culture, blood (routine x 2)     Status: None (Preliminary result)   Collection Time: 09/08/14 11:55 AM  Result Value Ref Range Status   Specimen Description BLOOD RIGHT ANTECUBITAL  Final   Special Requests BOTTLES DRAWN AEROBIC AND ANAEROBIC 10CC 10CC  Final   Culture   Final           BLOOD CULTURE RECEIVED NO GROWTH TO DATE CULTURE WILL BE HELD FOR 5 DAYS BEFORE ISSUING A FINAL NEGATIVE REPORT Performed at [facility]    Report Status PENDING  Incomplete  Culture, blood (routine x 2)     Status: None (Preliminary result)   Collection Time: 09/08/14 12:16 PM  Result Value Ref Range Status   Specimen Description BLOOD RIGHT ARM  Final   Special Requests BOTTLES DRAWN AEROBIC AND ANAEROBIC 10CC 10CC  Final   Culture   Final           BLOOD CULTURE RECEIVED NO  GROWTH TO DATE CULTURE WILL BE HELD FOR 5 DAYS BEFORE ISSUING A FINAL NEGATIVE REPORT Performed at [facility]    Report Status PENDING  Incomplete  Body fluid culture     Status: None (Preliminary result)   Collection Time: 09/10/14  4:29 PM  Result Value Ref Range Status   Specimen Description PERITONEAL FLUID  Final   Special Requests NONE  Final   Gram Stain   Final    Cytospin slide WBC PRESENT,BOTH PMN AND MONONUCLEAR NO ORGANISMS SEEN Performed at  [facility]    Culture NO GROWTH Performed at [facility]   Final   Report Status PENDING  Incomplete     Studies: Ct Abdomen Wo Contrast  09-28-14   CLINICAL DATA:  44 year old with cirrhosis and abnormal gallbladder. Concern for acute cholecystitis. Request for percutaneous cholecystostomy tube. Patient was scheduled for CT-guided cholecystostomy tube placement.  EXAM: CT ABDOMEN WITHOUT CONTRAST  TECHNIQUE: Multidetector CT imaging of the abdomen was performed following the standard protocol without IV contrast. Images through of the upper abdomen were obtained.  COMPARISON:  CT 09/07/2014 and MRI 09/08/2014  FINDINGS: Patient now has a large amount of perihepatic ascites along the right side of the liver and surrounding the gallbladder. There is high-density material in the gallbladder with a thickened wall along the medial aspect. Liver has a very unusual configuration with a markedly enlarged caudate lobe. Findings are compatible with cirrhosis. Again noted are large vessels along the anterior abdomen related to cirrhosis and likely portal hypertension. There is a diffuse mesenteric edema. No acute abnormalities involving the on liver, adrenal glands, pancreas and visualized kidneys.  IMPRESSION: Large amount of perihepatic ascites and a large amount of fluid surrounding the gallbladder.  High-density material throughout the gallbladder. Findings could be related to blood products, sludge or even vicarious  excretion of contrast from the previous CT examination. There is also marked thickening along the medial aspect of the gallbladder.  Cirrhosis.   Electronically Signed   By: Richarda Overlie M.D.   On: 2014-09-28 19:19   Nm Hepatobiliary Including Gb  09/09/2014   CLINICAL DATA:  44 year old male with cirrhosis, right upper quadrant abdominal pain x2 days of sudden onset. Fevers and chills nausea and vomiting. Gallbladder sludge and wall thickening. Initial encounter.  EXAM: NUCLEAR MEDICINE HEPATOBILIARY IMAGING  TECHNIQUE: Sequential images of the abdomen were obtained out to 60 minutes following intravenous administration of radiopharmaceutical.  RADIOPHARMACEUTICALS:  5.0 mCi Tc-65m Choletec IV  COMPARISON:  Abdomen MRI 09/08/2014. CT Abdomen and Pelvis 09/07/2014  FINDINGS: Abnormal liver contour. Mildly delayed radiotracer uptake by the liver and clearance of the blood pool. CBD and small bowel activity by 15 minutes.  90 minutes into the exam this study was augmented with morphine. However, visualization of the gallbladder never occurred out to 45 additional minutes of imaging.  IMPRESSION: Nonvisualization of the gallbladder despite morphine augmentation and extended imaging.  Recommend surgery consultation for presumed acute cholecystitis.  These results will be called to the ordering clinician or representative by the Radiologist Assistant, and communication documented in the PACS or zVision Dashboard.   Electronically Signed   By: Odessa Fleming M.D.   On: 09/09/2014 16:46   Ir Paracentesis  28-Sep-2014   CLINICAL DATA:  44 year old with abdominal pain and abnormal gallbladder. Scheduled for percutaneous cholecystostomy tube.  EXAM: IR PARACENTESIS  Physician: Rachelle Hora. Lowella Dandy, MD  FLUOROSCOPY TIME:  None  MEDICATIONS: None  ANESTHESIA/SEDATION: Moderate sedation time: None  PROCEDURE: Patient was scheduled for a CT-guided cholecystostomy tube placement. CT demonstrated a large amount of fluid around the liver and  gallbladder. The cholecystostomy tube was not performed due to the large amount of ascites and risk for peritonitis. Informed consent was obtained for an ultrasound-guided paracentesis. The right side of the abdomen was prepped and draped in sterile fashion. Skin was anesthetized with 1% lidocaine. A 19 gauge Yueh catheter was directed into the perihepatic ascites with ultrasound guidance. 1.2 L of clear yellow fluid was aspirated. Bandages placed at the  puncture site.  FINDINGS: Large amount of perihepatic ascites. 1.2 L of yellow fluid was removed. Sample was sent for analysis.  Estimated blood loss: Minimal  COMPLICATIONS: None  IMPRESSION: Ultrasound-guided paracentesis.  1.2 L of fluid was removed.   Electronically Signed   By: Richarda Overlie M.D.   On: 09/10/2014 19:22    Scheduled Meds: . folic acid  1 mg Oral Daily  . pantoprazole  40 mg Oral Q0600  . piperacillin-tazobactam (ZOSYN)  IV  3.375 g Intravenous Q8H  . thiamine  100 mg Intravenous Daily  . thiamine  100 mg Oral Daily   Continuous Infusions: . dextrose 5 % and 0.9 % NaCl with KCl 40 mEq/L 75 mL/hr at 09/11/14 1610    Active Problems:   Abdominal pain   Cholecystitis   Transaminitis   Alcoholic cirrhosis of liver without ascites   SIRS (systemic inflammatory response syndrome)   Pancreatitis, alcoholic, acute   Ascites    Time spent: 45 minutes (> 50% of the time dedicated to face to face evaluation, discussion with family at bedside regarding plan of care and interventions)    Vassie Loll  [facility] Pager [phone]. If 7PM-7AM, please contact night-coverage at [email or web address], password [facility] 09/11/2014, 2:16 PM  LOS: 4 days

## 2014-09-12 ENCOUNTER — Encounter: Payer: Self-pay | Admitting: Internal Medicine

## 2014-09-12 DIAGNOSIS — R109 Unspecified abdominal pain: Secondary | ICD-10-CM

## 2014-09-12 LAB — COMPREHENSIVE METABOLIC PANEL
ALT: 68 U/L — AB (ref 17–63)
AST: 158 U/L — ABNORMAL HIGH (ref 15–41)
Albumin: 2 g/dL — ABNORMAL LOW (ref 3.5–5.0)
Alkaline Phosphatase: 208 U/L — ABNORMAL HIGH (ref 38–126)
Anion gap: 5 (ref 5–15)
BUN: 6 mg/dL (ref 6–20)
CHLORIDE: 109 mmol/L (ref 101–111)
CO2: 20 mmol/L — ABNORMAL LOW (ref 22–32)
CREATININE: 0.49 mg/dL — AB (ref 0.61–1.24)
Calcium: 7.3 mg/dL — ABNORMAL LOW (ref 8.9–10.3)
GFR calc non Af Amer: >60 mL/min (ref >60)
Glucose, Bld: 108 mg/dL — ABNORMAL HIGH (ref 65–99)
Potassium: 4.8 mmol/L (ref 3.5–5.1)
Sodium: 134 mmol/L — ABNORMAL LOW (ref 135–145)
TOTAL PROTEIN: 6 g/dL — AB (ref 6.5–8.1)
Total Bilirubin: 3.6 mg/dL — ABNORMAL HIGH (ref 0.3–1.2)

## 2014-09-12 LAB — CBC
HCT: 36.4 % — ABNORMAL LOW (ref 39.0–52.0)
Hemoglobin: 12.9 g/dL — ABNORMAL LOW (ref 13.0–17.0)
MCH: 32.3 pg (ref 26.0–34.0)
MCHC: 35.4 g/dL (ref 30.0–36.0)
MCV: 91 fL (ref 78.0–100.0)
Platelets: 117 10*3/uL — ABNORMAL LOW (ref 150–400)
RBC: 4 MIL/uL — AB (ref 4.22–5.81)
RDW: 15.3 % (ref 11.5–15.5)
WBC: 8 10*3/uL (ref 4.0–10.5)

## 2014-09-12 LAB — MAGNESIUM: Magnesium: 1.6 mg/dL — ABNORMAL LOW (ref 1.7–2.4)

## 2014-09-12 MED ORDER — METHOCARBAMOL 500 MG PO TABS
500.0000 mg | ORAL_TABLET | Freq: Three times a day (TID) | ORAL | Status: DC | PRN
  Administered 2014-09-12 – 2014-09-13 (×4): 500 mg via ORAL
  Filled 2014-09-12 (×5): qty 1

## 2014-09-12 MED ORDER — MAGNESIUM SULFATE 2 GM/50ML IV SOLN
2.0000 g | Freq: Once | INTRAVENOUS | Status: AC
  Administered 2014-09-12: 2 g via INTRAVENOUS
  Filled 2014-09-12: qty 50

## 2014-09-12 NOTE — Progress Notes (Signed)
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  -per GI will need eventually EGD; as image studies suggesting portal HTN -continue PPI -alcohol cessation -monitor LFT's -follow clinical response -discussed with GI and no need for prednisone at this point -will follow rec's -no further appreciated ascites today; but he had 1.2L removed fluid around liver and gallbladder -last INR1.57   2-alcohol abuse with withdrawal: -continue thiamine -continue folic acid -continue CIWA -cessation counseling provided  3-SIRS due to acute cholecystitis: -continue antibiotics (now on zosyn) -CCS consulted (not a candidate for acute cholecystectomy due to cirrhosis and thrombocytopenia); recommendations has been for treatment with antibiotics and to advance diet -HIDA positive for cholecystitis and with concerns of obstructed cystic duct -on zosyn now with non-operative management of cholecystitis  -platelets improving and also improvement in his LFT's -patient with high risk for peritonitis given ascites if Percutaneous drain is placed.  4-thrombocytopenia: continue monitoring -no signs of overt bleeding currently -Platelets in 117,000 range  5-hyponatremia: due to cirrhosis -has improved/stable now -will monitor  6-pancreatitis: due to alcohol -will continue bowel rest and supportive care -will monitor lipase trend -CT abd demonstrated no pseudocyst and normal pancreas -CMET in am  7-hepatitis B surface antigen positive: neg IMG; suggesting chronic hepatitis; unsure of activity  -will check Hep DNA, Hep B E antigen and antibody -will follow GI rec's  Code Status: Full Family Communication: daughter and mother at bedside  Disposition Plan: continue IV antibiotics; will need cholecystostomy drain (as not a good candidate for  surgery)   Consultants:  PCCM  GI  CCS  IR  Procedures:  See below for x-ray reports  HIDA scan: positive for cholecystitis   Paracentesis 5/21: 1.2 L  Antibiotics:  cipro 5/20>>5/22  Zosyn 5/22  HPI/Subjective: No CP, no SOB. Reports no abd pain. Platelets better and stable LFT's. No further fever and no nausea or vomiting.   Objective: Filed Vitals:   09/12/14 1513  BP: 130/75  Pulse: 83  Temp: 97.6 F (36.4 C)  Resp: 19    Intake/Output Summary (Last 24 hours) at 09/12/14 1705 Last data filed at 09/12/14 1500  Gross per 24 hour  Intake 1392.5 ml  Output      0 ml  Net 1392.5 ml   Filed Weights   09/07/14 2133 09/08/14 0500  Weight: 73.755 kg (162 lb 9.6 oz) 73.75 kg (162 lb 9.4 oz)    Exam:   General:  No further fever; denies nausea and vomiting. Less jaundice and with stable LFT's and improvement in his platelets level. Patient denies abd pain  Cardiovascular: S1 and S2, no rubs, no gallops  Respiratory: CTA bilaterally  Abdomen: soft, NT, no guarding, positive BS  Musculoskeletal: no cyanosis, no edema   Data Reviewed: Basic Metabolic Panel:  Recent Labs Lab 09/07/14 1555 09/08/14 0320 09/09/14 0344 09/11/14 0525 09/12/14 0355  NA 132* 132* 135 134* 134*  K 4.3 3.1* 4.5 3.7 4.8  CL 91* 96* 103 108 109  CO2  --  26 27 20* 20*  GLUCOSE 137* 108* 112* 118* 108*  BUN CREATININE 0.60* 0.55* 0.69 0.53* 0.49*  CALCIUM  --  7.5* 7.3* 7.0* 7.3*  MG  --  1.0*  --   --  1.6*  PHOS  --  2.7  --   --   --  Liver Function Tests:  Recent Labs Lab 09/07/14 1547 09/08/14 0320 09/09/14 0344 09/11/14 0525 09/12/14 0355  AST 200* 191* 174* 143* 158*  ALT 103* 98* 82* 66* 68*  ALKPHOS 217* 211* 195* 196* 208*  BILITOT 3.2* 2.8* 3.6* 3.2* 3.6*  PROT 7.0 7.1 5.6* 5.8* 6.0*  ALBUMIN 2.9* 2.7* 2.1* 1.9* 2.0*    Recent Labs Lab 09/07/14 1547 09/07/14 1826 09/08/14 0320 09/09/14 0344 09/11/14 0525  LIPASE 182*  --   123* 150* 184*  AMYLASE  --  139* 119*  --   --     Recent Labs Lab 09/07/14 1826  AMMONIA 31   CBC:  Recent Labs Lab 09/07/14 1547  09/08/14 0320 09/08/14 1214 09/09/14 0344 09/11/14 0525 09/12/14 0355  WBC 7.1  --  6.2 6.5 5.6 7.8 8.0  NEUTROABS 4.6  --   --   --   --   --   --   HGB 13.3  < > 13.1 12.4* 12.7* 12.1* 12.9*  HCT 36.6*  < > 36.4* 34.8* 35.3* 33.9* 36.4*  MCV 88.8  --  89.7 89.5 90.3 89.4 91.0  PLT 91*  --  43* 40* 64* 84* 117*  < > = values in this interval not displayed.  Recent Results (from the past 240 hour(s))  Urine culture     Status: None   Collection Time: 09/07/14  4:25 PM  Result Value Ref Range Status   Specimen Description URINE, CATHETERIZED  Final   Special Requests NONE  Final   Colony Count   Final    20,OOO COLONIES/ML Performed at [facility]    Culture   Final    Multiple bacterial morphotypes present, none predominant. Suggest appropriate recollection if clinically indicated. Performed at [facility]    Report Status 09/09/2014 FINAL  Final  MRSA PCR Screening     Status: None   Collection Time: 09/07/14  9:33 PM  Result Value Ref Range Status   MRSA by PCR NEGATIVE NEGATIVE Final    Comment:        The GeneXpert MRSA Assay (FDA approved for NASAL specimens only), is one component of a comprehensive MRSA colonization surveillance program. It is not intended to diagnose MRSA infection nor to guide or monitor treatment for MRSA infections.   Culture, blood (routine x 2)     Status: None (Preliminary result)   Collection Time: 09/08/14 11:55 AM  Result Value Ref Range Status   Specimen Description BLOOD RIGHT ANTECUBITAL  Final   Special Requests BOTTLES DRAWN AEROBIC AND ANAEROBIC 10CC 10CC  Final   Culture   Final           BLOOD CULTURE RECEIVED NO GROWTH TO DATE CULTURE WILL BE HELD FOR 5 DAYS BEFORE ISSUING A FINAL NEGATIVE REPORT Performed at [facility]    Report Status PENDING   Incomplete  Culture, blood (routine x 2)     Status: None (Preliminary result)   Collection Time: 09/08/14 12:16 PM  Result Value Ref Range Status   Specimen Description BLOOD RIGHT ARM  Final   Special Requests BOTTLES DRAWN AEROBIC AND ANAEROBIC 10CC 10CC  Final   Culture   Final           BLOOD CULTURE RECEIVED NO GROWTH TO DATE CULTURE WILL BE HELD FOR 5 DAYS BEFORE ISSUING A FINAL NEGATIVE REPORT Performed at [facility]    Report Status PENDING  Incomplete  Body fluid culture  Status: None (Preliminary result)   Collection Time: Sep 12, 2014  4:29 PM  Result Value Ref Range Status   Specimen Description PERITONEAL FLUID  Final   Special Requests NONE  Final   Gram Stain   Final    Cytospin slide WBC PRESENT,BOTH PMN AND MONONUCLEAR NO ORGANISMS SEEN Performed at [facility]    Culture   Final    NO GROWTH 1 DAY Performed at [facility]    Report Status PENDING  Incomplete     Studies: Ct Abdomen Wo Contrast  09-12-2014   CLINICAL DATA:  44 year old with cirrhosis and abnormal gallbladder. Concern for acute cholecystitis. Request for percutaneous cholecystostomy tube. Patient was scheduled for CT-guided cholecystostomy tube placement.  EXAM: CT ABDOMEN WITHOUT CONTRAST  TECHNIQUE: Multidetector CT imaging of the abdomen was performed following the standard protocol without IV contrast. Images through of the upper abdomen were obtained.  COMPARISON:  CT 09/07/2014 and MRI 09/08/2014  FINDINGS: Patient now has a large amount of perihepatic ascites along the right side of the liver and surrounding the gallbladder. There is high-density material in the gallbladder with a thickened wall along the medial aspect. Liver has a very unusual configuration with a markedly enlarged caudate lobe. Findings are compatible with cirrhosis. Again noted are large vessels along the anterior abdomen related to cirrhosis and likely portal hypertension. There is a diffuse  mesenteric edema. No acute abnormalities involving the on liver, adrenal glands, pancreas and visualized kidneys.  IMPRESSION: Large amount of perihepatic ascites and a large amount of fluid surrounding the gallbladder.  High-density material throughout the gallbladder. Findings could be related to blood products, sludge or even vicarious excretion of contrast from the previous CT examination. There is also marked thickening along the medial aspect of the gallbladder.  Cirrhosis.   Electronically Signed   By: Richarda Overlie M.D.   On: 12-Sep-2014 19:19    Scheduled Meds: . folic acid  1 mg Oral Daily  . pantoprazole  40 mg Oral Q0600  . piperacillin-tazobactam (ZOSYN)  IV  3.375 g Intravenous Q8H  . thiamine  100 mg Intravenous Daily  . thiamine  100 mg Oral Daily   Continuous Infusions: . dextrose 5 % and 0.9 % NaCl with KCl 40 mEq/L 75 mL/hr at 09/12/14 1401    Active Problems:   Abdominal pain   Cholecystitis   Transaminitis   Alcoholic cirrhosis of liver without ascites   SIRS (systemic inflammatory response syndrome)   Pancreatitis, alcoholic, acute   Ascites   Acute liver failure    Time spent: 45 minutes (> 50% of the time dedicated to face to face evaluation, discussion with family at bedside regarding plan of care and interventions)    Vassie Loll  [facility] Pager [phone]. If 7PM-7AM, please contact night-coverage at [email or web address], password [facility] 09/12/2014, 5:05 PM  LOS: 5 days

## 2014-09-12 NOTE — Progress Notes (Signed)
[facility] Progress Note    Subjective: Patient doing well.  Much less pain.  Paracentesis done without Perc GB drain or biopsy.  Objective: Vital signs in last 24 hours: Temp:  [98 F (36.7 C)-99.1 F (37.3 C)] 98 F (36.7 C) (05/23 0503) Pulse Rate:  [62-86] 62 (05/23 0503) Resp:  [17-18] 18 (05/23 0503) BP: (113-120)/(61-70) 120/70 mmHg (05/23 0503) SpO2:  [97 %-100 %] 100 % (05/23 0503) Last BM Date: 09/12/14  Intake/Output from previous day:   Intake/Output this shift:    General: No acute distress.  Hungry.  Lungs: Clear  Abd: Soft, completely non-tender, good bowel sounds.  Extremities: No changes  Neuro: Intact  Lab Results:  @LABLAST2 (wbc:2,hgb:2,hct:2,plt:2) BMET  Recent Labs  09/11/14 0525 09/12/14 0355  NA 134* 134*  K 3.7 4.8  CL 108 109  CO2 20* 20*  GLUCOSE 118* 108*  BUN 7 6  CREATININE 0.53* 0.49*  CALCIUM 7.0* 7.3*   PT/INR  Recent Labs  09/09/14 1108 09/10/14 0952  LABPROT 20.3* 18.8*  INR 1.74* 1.57*   ABG No results for input(s): PHART, HCO3 in the last 72 hours.  Invalid input(s): PCO2, PO2  Studies/Results: Ct Abdomen Wo Contrast  09/10/2014   CLINICAL DATA:  44 year old with cirrhosis and abnormal gallbladder. Concern for acute cholecystitis. Request for percutaneous cholecystostomy tube. Patient was scheduled for CT-guided cholecystostomy tube placement.  EXAM: CT ABDOMEN WITHOUT CONTRAST  TECHNIQUE: Multidetector CT imaging of the abdomen was performed following the standard protocol without IV contrast. Images through of the upper abdomen were obtained.  COMPARISON:  CT 09/07/2014 and MRI 09/08/2014  FINDINGS: Patient now has a large amount of perihepatic ascites along the right side of the liver and surrounding the gallbladder. There is high-density material in the gallbladder with a thickened wall along the medial aspect. Liver has a very unusual configuration with a markedly enlarged caudate lobe. Findings are compatible  with cirrhosis. Again noted are large vessels along the anterior abdomen related to cirrhosis and likely portal hypertension. There is a diffuse mesenteric edema. No acute abnormalities involving the on liver, adrenal glands, pancreas and visualized kidneys.  IMPRESSION: Large amount of perihepatic ascites and a large amount of fluid surrounding the gallbladder.  High-density material throughout the gallbladder. Findings could be related to blood products, sludge or even vicarious excretion of contrast from the previous CT examination. There is also marked thickening along the medial aspect of the gallbladder.  Cirrhosis.   Electronically Signed   By: Richarda OverlieAdam  Henn M.D.   On: 09/10/2014 19:19   Ir Paracentesis  09/10/2014   CLINICAL DATA:  44 year old with abdominal pain and abnormal gallbladder. Scheduled for percutaneous cholecystostomy tube.  EXAM: IR PARACENTESIS  Physician: Rachelle HoraAdam R. Lowella DandyHenn, MD  FLUOROSCOPY TIME:  None  MEDICATIONS: None  ANESTHESIA/SEDATION: Moderate sedation time: None  PROCEDURE: Patient was scheduled for a CT-guided cholecystostomy tube placement. CT demonstrated a large amount of fluid around the liver and gallbladder. The cholecystostomy tube was not performed due to the large amount of ascites and risk for peritonitis. Informed consent was obtained for an ultrasound-guided paracentesis. The right side of the abdomen was prepped and draped in sterile fashion. Skin was anesthetized with 1% lidocaine. A 19 gauge Yueh catheter was directed into the perihepatic ascites with ultrasound guidance. 1.2 L of clear yellow fluid was aspirated. Bandages placed at the puncture site.  FINDINGS: Large amount of perihepatic ascites. 1.2 L of yellow fluid was removed. Sample was sent for analysis.  Estimated blood loss:  Minimal  COMPLICATIONS: None  IMPRESSION: Ultrasound-guided paracentesis.  1.2 L of fluid was removed.   Electronically Signed   By: Richarda Overlie M.D.   On: 09/10/2014 19:22     Anti-infectives: Anti-infectives    Start     Dose/Rate Route Frequency Ordered Stop   09/11/14 1200  piperacillin-tazobactam (ZOSYN) IVPB 3.375 g     3.375 g 12.5 mL/hr over 240 Minutes Intravenous Every 8 hours 09/11/14 1052     09/09/14 1700  cefTRIAXone (ROCEPHIN) 2 g in dextrose 5 % 50 mL IVPB - Premix  Status:  Discontinued    Comments:  Pharmacy may adjust dosing strength / duration / interval for maximal efficacy   2 g 100 mL/hr over 30 Minutes Intravenous Every 24 hours 09/09/14 1024 09/11/14 1036   09/09/14 1000  ciprofloxacin (CIPRO) IVPB 400 mg  Status:  Discontinued     400 mg 200 mL/hr over 60 Minutes Intravenous Every 12 hours 09/09/14 0842 09/09/14 1024   09/08/14 1700  cefTRIAXone (ROCEPHIN) 1 g in dextrose 5 % 50 mL IVPB  Status:  Discontinued     1 g 100 mL/hr over 30 Minutes Intravenous Every 24 hours 09/07/14 2118 09/08/14 1045   09/07/14 1715  cefTRIAXone (ROCEPHIN) 1 g in dextrose 5 % 50 mL IVPB     1 g 100 mL/hr over 30 Minutes Intravenous  Once 09/07/14 1708 09/07/14 1752      Assessment/Plan: s/p  Advance diet Check paracentesis fluid.  LOS: 5 days   Marta Lamas. Gae Bon, MD, FACS [phone] [facility] 09/12/2014

## 2014-09-13 LAB — COMPREHENSIVE METABOLIC PANEL
ALBUMIN: 1.7 g/dL — AB (ref 3.5–5.0)
ALT: 68 U/L — AB (ref 17–63)
ANION GAP: 2 — AB (ref 5–15)
AST: 137 U/L — ABNORMAL HIGH (ref 15–41)
Alkaline Phosphatase: 201 U/L — ABNORMAL HIGH (ref 38–126)
BILIRUBIN TOTAL: 2.4 mg/dL — AB (ref 0.3–1.2)
BUN: 5 mg/dL — ABNORMAL LOW (ref 6–20)
CHLORIDE: 108 mmol/L (ref 101–111)
CO2: 21 mmol/L — ABNORMAL LOW (ref 22–32)
CREATININE: 0.52 mg/dL — AB (ref 0.61–1.24)
Calcium: 7.4 mg/dL — ABNORMAL LOW (ref 8.9–10.3)
GFR calc non Af Amer: >60 mL/min (ref >60)
GLUCOSE: 217 mg/dL — AB (ref 65–99)
Potassium: 5.8 mmol/L — ABNORMAL HIGH (ref 3.5–5.1)
SODIUM: 131 mmol/L — AB (ref 135–145)
TOTAL PROTEIN: 5.4 g/dL — AB (ref 6.5–8.1)

## 2014-09-13 LAB — CBC
HCT: 29 % — ABNORMAL LOW (ref 39.0–52.0)
Hemoglobin: 10.3 g/dL — ABNORMAL LOW (ref 13.0–17.0)
MCH: 32.8 pg (ref 26.0–34.0)
MCHC: 35.5 g/dL (ref 30.0–36.0)
MCV: 92.4 fL (ref 78.0–100.0)
PLATELETS: 126 10*3/uL — AB (ref 150–400)
RBC: 3.14 MIL/uL — AB (ref 4.22–5.81)
RDW: 15.5 % (ref 11.5–15.5)
WBC: 5.8 10*3/uL (ref 4.0–10.5)

## 2014-09-13 MED ORDER — PANTOPRAZOLE SODIUM 40 MG PO TBEC: 40.0000 mg | DELAYED_RELEASE_TABLET | Freq: Every day | ORAL | Status: AC

## 2014-09-13 MED ORDER — BUSPIRONE HCL 5 MG PO TABS
5.0000 mg | ORAL_TABLET | Freq: Three times a day (TID) | ORAL | Status: DC
Start: 2014-09-13 — End: 2015-06-29

## 2014-09-13 MED ORDER — OXYCODONE HCL 5 MG PO TABS: 5.0000 mg | ORAL_TABLET | Freq: Four times a day (QID) | ORAL | Status: DC | PRN

## 2014-09-13 MED ORDER — METHOCARBAMOL 500 MG PO TABS: 500.0000 mg | ORAL_TABLET | Freq: Three times a day (TID) | ORAL | Status: DC | PRN

## 2014-09-13 MED ORDER — AMOXICILLIN-POT CLAVULANATE 500-125 MG PO TABS: 1.0000 | ORAL_TABLET | Freq: Three times a day (TID) | ORAL | Status: DC

## 2014-09-13 NOTE — Progress Notes (Signed)
Patient ID: Blake Lambert, male   DOB: 1970-05-29, 44 y.o.   MRN: [id number]     [facility]      [address] [place], [place] [address]    Phone: [phone] FAX: [phone]     Subjective: Had "whole body cramps" Denies abd pain, nausea or vomiting.  VSS.  AFebrile.  WBC normal, LFTs are down.    Objective:  Vital signs:  Filed Vitals:   09/12/14 0503 09/12/14 1513 09/12/14 1943 09/13/14 0444  BP: 120/70 130/75 110/69 127/76  Pulse: 62 83 75 67  Temp: 98 F (36.7 C) 97.6 F (36.4 C) 98.4 F (36.9 C) 98.2 F (36.8 C)  TempSrc: Oral Oral Oral Oral  Resp: 18 19 18 18   Height:      Weight:      SpO2: 100% 100% 100% 100%    Last BM Date: 09/12/14  Intake/Output   Yesterday:  05/23 0701 - 05/24 0700 In: 386.3 [P.O.:240; I.V.:146.3] Out: -  This shift:    I/O last 3 completed shifts: In: 1392.5 [P.O.:240; I.V.:1152.5] Out: -        Physical Exam: General: Pt awake/alert/oriented x4 in no acute distress  Abdomen: Soft.  Nondistended. Non tender.  No evidence of peritonitis.  No incarcerated hernias.    Problem List:   Active Problems:   Abdominal pain   Cholecystitis   Transaminitis   Alcoholic cirrhosis of liver without ascites   SIRS (systemic inflammatory response syndrome)   Pancreatitis, alcoholic, acute   Ascites   Acute liver failure    Results:   Labs: Results for orders placed or performed during the hospital encounter of 09/07/14 (from the past 48 hour(s))  Magnesium     Status: Abnormal   Collection Time: 09/12/14  3:55 AM  Result Value Ref Range   Magnesium 1.6 (L) 1.7 - 2.4 mg/dL  CBC     Status: Abnormal   Collection Time: 09/12/14  3:55 AM  Result Value Ref Range   WBC 8.0 4.0 - 10.5 K/uL   RBC 4.00 (L) 4.22 - 5.81 MIL/uL   Hemoglobin 12.9 (L) 13.0 - 17.0 g/dL   HCT 36.4 (L) 39.0 - 52.0 %   MCV 91.0 78.0 - 100.0 fL   MCH 32.3 26.0 - 34.0 pg   MCHC 35.4 30.0 - 36.0 g/dL    RDW 15.3 11.5 - 15.5 %   Platelets 117 (L) 150 - 400 K/uL    Comment: REPEATED TO VERIFY CONSISTENT WITH PREVIOUS RESULT   Comprehensive metabolic panel     Status: Abnormal   Collection Time: 09/12/14  3:55 AM  Result Value Ref Range   Sodium 134 (L) 135 - 145 mmol/L   Potassium 4.8 3.5 - 5.1 mmol/L    Comment: DELTA CHECK NOTED   Chloride 109 101 - 111 mmol/L   CO2 20 (L) 22 - 32 mmol/L   Glucose, Bld 108 (H) 65 - 99 mg/dL   BUN 6 6 - 20 mg/dL   Creatinine, Ser 0.49 (L) 0.61 - 1.24 mg/dL   Calcium 7.3 (L) 8.9 - 10.3 mg/dL   Total Protein 6.0 (L) 6.5 - 8.1 g/dL   Albumin 2.0 (L) 3.5 - 5.0 g/dL   AST 158 (H) 15 - 41 U/L   ALT 68 (H) 17 - 63 U/L   Alkaline Phosphatase 208 (H) 38 - 126 U/L   Total Bilirubin 3.6 (H) 0.3 - 1.2 mg/dL   GFR calc non Af  Amer >60 >60 mL/min   GFR calc Af Amer >60 >60 mL/min    Comment: (NOTE) The eGFR has been calculated using the CKD EPI equation. This calculation has not been validated in all clinical situations. eGFR's persistently <60 mL/min signify possible Chronic Kidney Disease.    Anion gap 5 5 - 15  CBC     Status: Abnormal   Collection Time: 09/13/14  4:35 AM  Result Value Ref Range   WBC 5.8 4.0 - 10.5 K/uL   RBC 3.14 (L) 4.22 - 5.81 MIL/uL   Hemoglobin 10.3 (L) 13.0 - 17.0 g/dL    Comment: REPEATED TO VERIFY   HCT 29.0 (L) 39.0 - 52.0 %   MCV 92.4 78.0 - 100.0 fL   MCH 32.8 26.0 - 34.0 pg   MCHC 35.5 30.0 - 36.0 g/dL   RDW 15.5 11.5 - 15.5 %   Platelets 126 (L) 150 - 400 K/uL    Comment: REPEATED TO VERIFY  Comprehensive metabolic panel     Status: Abnormal   Collection Time: 09/13/14  4:35 AM  Result Value Ref Range   Sodium 131 (L) 135 - 145 mmol/L   Potassium 5.8 (H) 3.5 - 5.1 mmol/L   Chloride 108 101 - 111 mmol/L   CO2 21 (L) 22 - 32 mmol/L   Glucose, Bld 217 (H) 65 - 99 mg/dL   BUN 5 (L) 6 - 20 mg/dL   Creatinine, Ser 0.52 (L) 0.61 - 1.24 mg/dL   Calcium 7.4 (L) 8.9 - 10.3 mg/dL   Total Protein 5.4 (L) 6.5 - 8.1  g/dL   Albumin 1.7 (L) 3.5 - 5.0 g/dL   AST 137 (H) 15 - 41 U/L   ALT 68 (H) 17 - 63 U/L   Alkaline Phosphatase 201 (H) 38 - 126 U/L   Total Bilirubin 2.4 (H) 0.3 - 1.2 mg/dL   GFR calc non Af Amer >60 >60 mL/min   GFR calc Af Amer >60 >60 mL/min    Comment: (NOTE) The eGFR has been calculated using the CKD EPI equation. This calculation has not been validated in all clinical situations. eGFR's persistently <60 mL/min signify possible Chronic Kidney Disease.    Anion gap 2 (L) 5 - 15    Comment: RESULT CHECKED    Imaging / Studies: No results found.  Medications / Allergies:  Scheduled Meds: . folic acid  1 mg Oral Daily  . pantoprazole  40 mg Oral Q0600  . piperacillin-tazobactam (ZOSYN)  IV  3.375 g Intravenous Q8H  . thiamine  100 mg Intravenous Daily  . thiamine  100 mg Oral Daily   Continuous Infusions: . dextrose 5 % and 0.9 % NaCl with KCl 40 mEq/L 75 mL/hr at 09/12/14 1401   PRN Meds:.sodium chloride, methocarbamol, morphine injection, ondansetron (ZOFRAN) IV  Antibiotics: Anti-infectives    Start     Dose/Rate Route Frequency Ordered Stop   09/11/14 1200  piperacillin-tazobactam (ZOSYN) IVPB 3.375 g     3.375 g 12.5 mL/hr over 240 Minutes Intravenous Every 8 hours 09/11/14 1052     09/09/14 1700  cefTRIAXone (ROCEPHIN) 2 g in dextrose 5 % 50 mL IVPB - Premix  Status:  Discontinued    Comments:  Pharmacy may adjust dosing strength / duration / interval for maximal efficacy   2 g 100 mL/hr over 30 Minutes Intravenous Every 24 hours 09/09/14 1024 09/11/14 1036   09/09/14 1000  ciprofloxacin (CIPRO) IVPB 400 mg  Status:  Discontinued  400 mg 200 mL/hr over 60 Minutes Intravenous Every 12 hours 09/09/14 0842 09/09/14 1024   09/08/14 1700  cefTRIAXone (ROCEPHIN) 1 g in dextrose 5 % 50 mL IVPB  Status:  Discontinued     1 g 100 mL/hr over 30 Minutes Intravenous Every 24 hours 09/07/14 2118 09/08/14 1045   09/07/14 1715  cefTRIAXone (ROCEPHIN) 1 g in dextrose 5 %  50 mL IVPB     1 g 100 mL/hr over 30 Minutes Intravenous  Once 09/07/14 1708 09/07/14 1752        Assessment/Plan Cholecystitis  Alcoholic cirrhosis  The patient is not a surgical candidate given his cirrhosis.  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  Recommend changing to PO antibiotics to complete at 7 day course.  I had a lengthy conversation with the patient and his s/o.  Recommend a low fat diet. May discharge from surgical standpoint.  Please call [facility] with further assistance   Erby Pian, [facility] Pager [phone]([id number]) For consults and floor pages call [phone]([id number]-[id number])  09/13/2014 8:41 AM

## 2014-09-13 NOTE — Progress Notes (Signed)
Utilization review completed.  

## 2014-09-13 NOTE — Progress Notes (Signed)
09/13/2014 7:06 PM Discharge AVS meds taken today and those due this evening reviewed.  Follow-up appointments and when to call md reviewed.  D/C IV and TELE.  Questions and concerns addressed.   D/C home per orders. Kathryne HitchAllen, Tamala Manzer C

## 2014-09-13 NOTE — Care Management Note (Signed)
Case Management Note  Patient Details  Name: Blake Lambert MRN: [id number] Date of Birth: 05-21-1970  Subjective/Objective:   Pt admitted with cholecystitis                Action/Plan: PTA pt lived at home   Expected Discharge Date:                  Expected Discharge Plan:  Home/Self Care  In-House Referral:     Discharge planning Services  CM Consult, [facility] appt scheduled[facility]  Post Acute Care Choice:    Choice offered to:     DME Arranged:    DME Agency:     HH Arranged:    HH Agency:     Status of Service:  Completed, signed off  Medicare Important Message Given:  No Date Medicare IM Given:    Medicare IM give by:    Date Additional Medicare IM Given:    Additional Medicare Important Message give by:     If discussed at Long Length of Stay Meetings, dates discussed:  09/13/14  Additional Comments:  Pt for d/c home today, [facility] for May [facility] at 10:00 info given to pt and instructed to call clinic to reschedule if pt can not make appointment- pt also has abx for discharge- will assist pt for medications with MATCH- letter given to pt and instructions for program use  given $3 per prescription cost and one time use in a 12 month period- pt voiced understanding- also explained that pain meds where not covered under the program and would be out of pocket- list of participating pharmacies also given to pt. [facility]- she stated that she had spoken with pt's daughter a few days ago and would f/u with pt after discharge- her name and number was given to pt in case he had any questions for her. Darrold SpanWebster, Nathalie Cavendish Hall, RN 09/13/2014, 12:51 PM

## 2014-09-13 NOTE — Discharge Summary (Signed)
Physician Discharge Summary  Blake Lambert ZOX:[id number] DOB: 06/06/1970 DOA: 09/07/2014  PCP: No PCP Per Patient  Admit date: 09/07/2014 Discharge date: 09/13/2014  Time spent: > 30 minutes  Recommendations for Outpatient Follow-up:  1. Repeat CMET to follow electrolytes and LFT's 2. CBC to follow Hgb and platelets trend 3. Patient to follow with GI for further evaluation and treatment of his cirrhosis and Hep B 4. Will follow with [facility]; unsure if he will be ever a surgical candidate; hoping abstinence allow him to achieve that.  Discharge Diagnoses:  Active Problems:   Abdominal pain   Cholecystitis   Transaminitis   Alcoholic cirrhosis of liver without ascites   SIRS (systemic inflammatory response syndrome)   Pancreatitis, alcoholic, acute   Ascites   Acute liver failure chronic Hep B  Discharge Condition: stable and improved. Will follow at [facility] to establish care with PCP; also follow up with GI and [facility] as an outpatient.  Diet recommendation: low sodium, low fat diet  Filed Weights   09/07/14 2133 09/08/14 0500  Weight: 73.755 kg (162 lb 9.6 oz) 73.75 kg (162 lb 9.4 oz)    History of present illness:  44 year old male with history of EtOH abuse presented to [facility] ED 5/18 c/o RUQ pain. He reports flu like symptoms for 3 weeks including N/V and myalgias. This progressively worsened until the point that he was unable to keep down solids. 5/18 he developed acute onset 10/10 abdominal pain which caused him to present to ED. He reports that he drinks 12-15 cans of beer per day, and smokes 3/4 pack of cigarettes daily for at least 20 years. Abdominal pain is localized to the RUQ. In ED he was given dilaudid for pain, which was refractory to several doses of this. He was sent for CT abdomen which showed severe cirrhosis with collateral veins suggesting portal hypertension, and large density concerning for hepatocellular carcinoma. Upon my evaluation in ED he still complains  of 10/10 RUQ pain with very short relief from IV narcotics. He also reports that he has had black colored stools since this AM, no hx of any hematochezia or hematemesis.  Hospital Course:  1-New diagnosis of cirrhosis in an alcoholic with etoh hepatitis and positive active chronic Hep B .  -per GI will need eventually EGD; as image studies suggesting portal HTN and most likely varices -continue PPI -alcohol cessation -monitor LFT's during follow up visit -avoid NSAID's and acetaminophen  -follow clinical response and need for further treatment/evaluation in outpatient setting -after X1 paracentesis done, no further ascites present -patient not discharge on diuretics  -advise to follow low sodium diet -last INR1.57   2-alcohol abuse with withdrawal: -continue thiamine, and continue folic acid with use of MV daily -cessation counseling provided -patient has promised to be in complete abstinence  3-SIRS due to acute cholecystitis: -continue antibiotics (now on Augmentin) plan is for 10 more days -[facility]S consulted (not a candidate for acute cholecystectomy due to cirrhosis and thrombocytopenia); recommendations has been for treatment with antibiotics and to advance diet as tolerated -HIDA positive for cholecystitis and with concerns of obstructed cystic duct -was on zosyn as inpatient; once process cooled off, was discharged on Augmentin. Will follow up with [facility] in 3 weeks as an outpatient. For now continue with non-operative management of cholecystitis  -platelets improving and also improvement in his LFT's -patient with high risk for peritonitis given ascites with Percutaneous drain, reason why was not implemented.   4-thrombocytopenia: due to cirrhosis -  no signs of overt bleeding currently -Platelets in 126,000 range -reassess CBC during follow up visit to check trend   5-hyponatremia: due to cirrhosis -has improved/stable now -will monitor during follow up visit  6-pancreatitis:  due to alcohol -no abd pain, no nausea, no vomiting and tolerating diet at discharge -CT abd demonstrated no pseudocyst and normal pancreas -CMET during follow up visit -alcohol cessation and low fat diet encouraged  7-hepatitis B surface antigen positive: neg IMG; suggesting chronic active hepatitis -will follow Hep DNA -Hep B E antigen reactive and antibody neg -will follow with GI for further evaluation and treatment -girlfriend advise to have herself check for this condition as well and to pursuit tx as needed  8-anxiety: discharged on Buspar -will need Psych referral in outpatient setting.  Procedures:  See below for x-ray reports  HIDA scan: positive for cholecystitis   Paracentesis 5/21: 1.2 L   Consultations:  PCCM  GI  CCS  IR  Discharge Exam: Filed Vitals:   09/13/14 0444  BP: 127/76  Pulse: 67  Temp: 98.2 F (36.8 C)  Resp: 18     General: No further fever; denies nausea and vomiting. Less jaundice and with stable/trending down LFT's and improvement in his platelets level. Patient denies abd pain and has been able to tolerate diet and medications.  Cardiovascular: S1 and S2, no rubs, no gallops  Respiratory: CTA bilaterally  Abdomen: soft, NT, no guarding, positive BS  Musculoskeletal: no cyanosis, no edema  Discharge Instructions   Discharge Instructions    Body fluid culture with gram stain    Complete by:  As directed      Diet - low sodium heart healthy    Complete by:  As directed      Discharge instructions    Complete by:  As directed   Follow low sodium diet (less than 2000-2500 mg) Take medications as prescribed  Follow up on 5/27 at [facility] to establish care with PCP Follow up with Gastroenterologist as instructed Follow up with Surgery in 2-3 weeks Follow low fat diet Maintain good hydration Stop drinking Take a multivitamin with iron on daily basis No over the counter medications for pain (motrin, ibuprofen,  aleve, advil, excedrin, goodie's etc..)          Current Discharge Medication List    START taking these medications   Details  amoxicillin-clavulanate (AUGMENTIN) 500-125 MG per tablet Take 1 tablet (500 mg total) by mouth 3 (three) times daily. Qty: 30 tablet, Refills: 0    busPIRone (BUSPAR) 5 MG tablet Take 1 tablet (5 mg total) by mouth 3 (three) times daily. Qty: 90 tablet, Refills: 0    methocarbamol (ROBAXIN) 500 MG tablet Take 1 tablet (500 mg total) by mouth every 8 (eight) hours as needed for muscle spasms. Qty: 45 tablet, Refills: 0    oxyCODONE (OXY IR/ROXICODONE) 5 MG immediate release tablet Take 1-2 tablets (5-10 mg total) by mouth every 6 (six) hours as needed for severe pain. Qty: 30 tablet, Refills: 0    pantoprazole (PROTONIX) 40 MG tablet Take 1 tablet (40 mg total) by mouth daily at 6 (six) AM. Qty: 30 tablet, Refills: 1      STOP taking these medications     Aspirin-Acetaminophen-Caffeine (GOODYS EXTRA STRENGTH PO)        No Known Allergies Follow-up Information    Follow up with Yancey Flemings, MD On 10/18/2014.   Specialty:  Gastroenterology   Why:  1:30 PM to follow up  liver disease and hepatitis. bring all medicines and insurance card with you    Contact information:   [address] [place] [address] [phone]       [facility] with [facility] On 09/16/2014.   Why:  f/u appointment at 10:00- call if you need to reschedule for next week   Contact information:   [address] [facility] [place] [id number] [phone]      Follow up with Liz MaladyHOMPSON,BURKE E, MD. Schedule an appointment as soon as possible for a visit in 3 weeks.   Specialty:  General Surgery   Why:  call office and make appointment to follow issues with gallbladder    Contact information:   [address] [place] [address] [phone]       The results of significant diagnostics from this hospitalization  (including imaging, microbiology, ancillary and laboratory) are listed below for reference.    Significant Diagnostic Studies: Ct Abdomen Wo Contrast  09/10/2014   CLINICAL DATA:  44 year old with cirrhosis and abnormal gallbladder. Concern for acute cholecystitis. Request for percutaneous cholecystostomy tube. Patient was scheduled for CT-guided cholecystostomy tube placement.  EXAM: CT ABDOMEN WITHOUT CONTRAST  TECHNIQUE: Multidetector CT imaging of the abdomen was performed following the standard protocol without IV contrast. Images through of the upper abdomen were obtained.  COMPARISON:  CT 09/07/2014 and MRI 09/08/2014  FINDINGS: Patient now has a large amount of perihepatic ascites along the right side of the liver and surrounding the gallbladder. There is high-density material in the gallbladder with a thickened wall along the medial aspect. Liver has a very unusual configuration with a markedly enlarged caudate lobe. Findings are compatible with cirrhosis. Again noted are large vessels along the anterior abdomen related to cirrhosis and likely portal hypertension. There is a diffuse mesenteric edema. No acute abnormalities involving the on liver, adrenal glands, pancreas and visualized kidneys.  IMPRESSION: Large amount of perihepatic ascites and a large amount of fluid surrounding the gallbladder.  High-density material throughout the gallbladder. Findings could be related to blood products, sludge or even vicarious excretion of contrast from the previous CT examination. There is also marked thickening along the medial aspect of the gallbladder.  Cirrhosis.   Electronically Signed   By: Richarda OverlieAdam  Henn M.D.   On: 09/10/2014 19:19   Mr Abdomen W NUWo Contrast  09/09/2014   ADDENDUM REPORT: 09/09/2014 08:42  ADDENDUM: I discussed these findings by telephone with Dr. Gwenlyn PerkingMadera at approximately [phone] hours on 09/09/2014.   Electronically Signed   By: Kennith CenterEric  Mansell M.D.   On: 09/09/2014 08:42   09/09/2014    CLINICAL DATA:  Initial encounter for three-week history of myalgia, nausea, and vomiting with flu-like symptoms. Now with acute onset of abdominal pain  EXAM: MRI ABDOMEN WITHOUT AND WITH CONTRAST  TECHNIQUE: Multiplanar multisequence MR imaging of the abdomen was performed both before and after the administration of intravenous contrast.  CONTRAST:  15mL MULTIHANCE GADOBENATE DIMEGLUMINE 529 MG/ML IV SOLN  COMPARISON:  CT scan from 09/07/2014.  FINDINGS: Lower chest: Subsegmental atelectasis seen in the right lung base above the asymmetrically elevated right hemidiaphragm.  Hepatobiliary: Markedly deformed enter regular liver compatible with advanced cirrhosis. Areas of parenchymal and capsular retraction centrally compatible with areas of confluent hepatic fibrosis. Motion degradation on postcontrast imaging makes assessment for hepatoma suboptimal. No definite enhancing hepatoma is identified.  Gallbladder is massively dilated with lumen full of debris/hemorrhage/ sludge. Associated diffuse gallbladder wall thickening  is evident. No evidence for intra or extrahepatic biliary duct dilatation.  Pancreas: No focal mass lesion. No dilatation of the main duct. No intraparenchymal cyst. No peripancreatic edema.  Spleen: No splenomegaly. No focal mass lesion.  Adrenals/Urinary Tract: No adrenal nodule or mass. No hydronephrosis or renal mass is evident.  Stomach/Bowel: Stomach is nondistended. No evidence for small bowel dilatation.  Vascular/Lymphatic: No abdominal aortic aneurysm. The portal vein appears patent.  Other: Small volume intraperitoneal free fluid appears slightly increased in the interval.  Musculoskeletal: No evidence for suspicious marrow enhancement within the visualized bony anatomy.  IMPRESSION: 1. Markedly dilated gallbladder is positioned anterior to the cirrhotic liver. Gallbladder lumen is diffusely filled with blood/sludge/debris with associated gallbladder wall thickening. Cholecystitis is a  distinct consideration. 2. Markedly irregular informed liver with areas of confluent hepatic fibrosis. There is substantial motion artifact on this study which markedly limits assessment for hepatoma. Close followup is recommended and repeat MRI after resolution of acute symptoms, when the patient is better able to cooperate with breath holding is recommended to further evaluate.  Electronically Signed: By: Kennith Center M.D. On: 09/09/2014 08:37   Nm Hepatobiliary Including Gb  09/09/2014   CLINICAL DATA:  44 year old male with cirrhosis, right upper quadrant abdominal pain x2 days of sudden onset. Fevers and chills nausea and vomiting. Gallbladder sludge and wall thickening. Initial encounter.  EXAM: NUCLEAR MEDICINE HEPATOBILIARY IMAGING  TECHNIQUE: Sequential images of the abdomen were obtained out to 60 minutes following intravenous administration of radiopharmaceutical.  RADIOPHARMACEUTICALS:  5.0 mCi Tc-56m Choletec IV  COMPARISON:  Abdomen MRI 09/08/2014. CT Abdomen and Pelvis 09/07/2014  FINDINGS: Abnormal liver contour. Mildly delayed radiotracer uptake by the liver and clearance of the blood pool. CBD and small bowel activity by 15 minutes.  90 minutes into the exam this study was augmented with morphine. However, visualization of the gallbladder never occurred out to 45 additional minutes of imaging.  IMPRESSION: Nonvisualization of the gallbladder despite morphine augmentation and extended imaging.  Recommend surgery consultation for presumed acute cholecystitis.  These results will be called to the ordering clinician or representative by the Radiologist Assistant, and communication documented in the PACS or zVision Dashboard.   Electronically Signed   By: Odessa Fleming M.D.   On: 09/09/2014 16:46   Ct Abdomen Pelvis W Contrast  09/07/2014   CLINICAL DATA:  Acute right upper quadrant abdominal pain.  EXAM: CT ABDOMEN AND PELVIS WITH CONTRAST  TECHNIQUE: Multidetector CT imaging of the abdomen and pelvis  was performed using the standard protocol following bolus administration of intravenous contrast.  CONTRAST:  OMNIPAQUE IOHEXOL 300 MG/ML  SOLN  COMPARISON:  None.  FINDINGS: Mild multilevel degenerative disc disease is noted in the lumbar spine. Visualized lung bases appear normal.  The spleen and pancreas appear normal. Gallbladder is not clearly identified, but no definite stones are noted. The liver is severely abnormal in shape and density with enlargement of the caudate lobe in severe small size of the right hepatic lobe, most consistent with severe hepatic cirrhosis. There is abnormal ill-defined density seen throughout the superior portion of the right hepatic lobe concerning for possible neoplasm. Adrenal glands and kidneys appear normal. Enlarged collateral veins are noted suggesting portal hypertension. There is no evidence of bowel obstruction. No abnormal fluid collection is noted. Urinary bladder appears normal. Abdominal aorta appears normal.  IMPRESSION: Findings are most consistent with severe hepatic cirrhosis with collateral veins suggesting portal hypertension. Large area of abnormal irregular density is seen  involving the superior portion of the right hepatic lobe concerning for possible neoplasm such as hepatocellular carcinoma. MRI scan is recommended for further evaluation.   Electronically Signed   By: Lupita Raider, M.D.   On: 09/07/2014 17:28   Dg Abd Acute W/chest  09/07/2014   CLINICAL DATA:  Flu-like symptoms for 3 weeks  EXAM: DG ABDOMEN ACUTE W/ 1V CHEST  COMPARISON:  07/07/14  FINDINGS: There is no evidence of dilated bowel loops or free intraperitoneal air. No radiopaque calculi or other significant radiographic abnormality is seen. Heart size and mediastinal contours are within normal limits. Both lungs are clear. Postsurgical changes are noted in the left clavicle  IMPRESSION: No acute abnormality noted.   Electronically Signed   By: Alcide Clever M.D.   On: 09/07/2014  16:23   US Abdomen Limited Ruq  09/08/2014   CLINICAL DATA:  Abdominal pain.  EXAM: US ABDOMEN LIMITED - RIGHT UPPER QUADRANT  COMPARISON:  CT 1 day prior.  FINDINGS: Gallbladder:  Not visualized.  Common bile duct:  Diameter: Not visualized.  Liver:  Technically limited exam due at bowel gas. Irregularly-shaped liver with nodular hepatic contour. Liver parenchyma is diffusely heterogeneous with question of multiple hepatic lesion. Hepatopetal blood flow is noted in the main portal vein with velocities 34 cm/second. The hepatic vasculature evaluation is limited and not clearly defined. No definite ascites in the right upper quadrant.  IMPRESSION: Markedly limited exam demonstrating irregular hepatic nodular contours, diffusely hepatic heterogeneous echotexture, and question of multiple hepatic lesions, all suboptimally assessed. MRI of the abdomen hepatic mass protocol is recommended for further characterization. The gallbladder as well as common bile duct are not visualized. The hepatic vasculature is not well demonstrated, and complete Doppler evaluation could not be performed.   Electronically Signed   By: Rubye Oaks M.D.   On: 09/08/2014 06:26   Ir Paracentesis  09/10/2014   CLINICAL DATA:  44 year old with abdominal pain and abnormal gallbladder. Scheduled for percutaneous cholecystostomy tube.  EXAM: IR PARACENTESIS  Physician: Rachelle Hora. Lowella Dandy, MD  FLUOROSCOPY TIME:  None  MEDICATIONS: None  ANESTHESIA/SEDATION: Moderate sedation time: None  PROCEDURE: Patient was scheduled for a CT-guided cholecystostomy tube placement. CT demonstrated a large amount of fluid around the liver and gallbladder. The cholecystostomy tube was not performed due to the large amount of ascites and risk for peritonitis. Informed consent was obtained for an ultrasound-guided paracentesis. The right side of the abdomen was prepped and draped in sterile fashion. Skin was anesthetized with 1% lidocaine. A 19 gauge Yueh catheter  was directed into the perihepatic ascites with ultrasound guidance. 1.2 L of clear yellow fluid was aspirated. Bandages placed at the puncture site.  FINDINGS: Large amount of perihepatic ascites. 1.2 L of yellow fluid was removed. Sample was sent for analysis.  Estimated blood loss: Minimal  COMPLICATIONS: None  IMPRESSION: Ultrasound-guided paracentesis.  1.2 L of fluid was removed.   Electronically Signed   By: Richarda Overlie M.D.   On: 09/10/2014 19:22    Microbiology: Recent Results (from the past 240 hour(s))  Urine culture     Status: None   Collection Time: 09/07/14  4:25 PM  Result Value Ref Range Status   Specimen Description URINE, CATHETERIZED  Final   Special Requests NONE  Final   Colony Count   Final    20,OOO COLONIES/ML Performed at [facility]    Culture   Final    Multiple bacterial morphotypes present, none predominant. Suggest  appropriate recollection if clinically indicated. Performed at [facility]    Report Status 09/09/2014 FINAL  Final  MRSA PCR Screening     Status: None   Collection Time: 09/07/14  9:33 PM  Result Value Ref Range Status   MRSA by PCR NEGATIVE NEGATIVE Final    Comment:        The GeneXpert MRSA Assay (FDA approved for NASAL specimens only), is one component of a comprehensive MRSA colonization surveillance program. It is not intended to diagnose MRSA infection nor to guide or monitor treatment for MRSA infections.   Culture, blood (routine x 2)     Status: None (Preliminary result)   Collection Time: 09/08/14 11:55 AM  Result Value Ref Range Status   Specimen Description BLOOD RIGHT ANTECUBITAL  Final   Special Requests BOTTLES DRAWN AEROBIC AND ANAEROBIC 10CC 10CC  Final   Culture   Final           BLOOD CULTURE RECEIVED NO GROWTH TO DATE CULTURE WILL BE HELD FOR 5 DAYS BEFORE ISSUING A FINAL NEGATIVE REPORT Performed at [facility]    Report Status PENDING  Incomplete  Culture, blood (routine x 2)      Status: None (Preliminary result)   Collection Time: 09/08/14 12:16 PM  Result Value Ref Range Status   Specimen Description BLOOD RIGHT ARM  Final   Special Requests BOTTLES DRAWN AEROBIC AND ANAEROBIC 10CC 10CC  Final   Culture   Final           BLOOD CULTURE RECEIVED NO GROWTH TO DATE CULTURE WILL BE HELD FOR 5 DAYS BEFORE ISSUING A FINAL NEGATIVE REPORT Performed at [facility]    Report Status PENDING  Incomplete  Body fluid culture     Status: None (Preliminary result)   Collection Time: 09/10/14  4:29 PM  Result Value Ref Range Status   Specimen Description PERITONEAL FLUID  Final   Special Requests NONE  Final   Gram Stain   Final    Cytospin slide WBC PRESENT,BOTH PMN AND MONONUCLEAR NO ORGANISMS SEEN Performed at [facility]    Culture   Final    NO GROWTH 2 DAYS Performed at [facility]    Report Status PENDING  Incomplete     Labs: Basic Metabolic Panel:  Recent Labs Lab 09/08/14 0320 09/09/14 0344 09/11/14 0525 09/12/14 0355 09/13/14 0435  NA 132* 135 134* 134* 131*  K 3.1* 4.5 3.7 4.8 5.8*  CL 96* 103 108 109 108  CO2 26 27 20* 20* 21*  GLUCOSE 108* 112* 118* 108* 217*  BUN 5*  CREATININE 0.55* 0.69 0.53* 0.49* 0.52*  CALCIUM 7.5* 7.3* 7.0* 7.3* 7.4*  MG 1.0*  --   --  1.6*  --   PHOS 2.7  --   --   --   --    Liver Function Tests:  Recent Labs Lab 09/08/14 0320 09/09/14 0344 09/11/14 0525 09/12/14 0355 09/13/14 0435  AST 191* 174* 143* 158* 137*  ALT 98* 82* 66* 68* 68*  ALKPHOS 211* 195* 196* 208* 201*  BILITOT 2.8* 3.6* 3.2* 3.6* 2.4*  PROT 7.1 5.6* 5.8* 6.0* 5.4*  ALBUMIN 2.7* 2.1* 1.9* 2.0* 1.7*    Recent Labs Lab 09/07/14 1547 09/07/14 1826 09/08/14 0320 09/09/14 0344 09/11/14 0525  LIPASE 182*  --  123* 150* 184*  AMYLASE  --  139* 119*  --   --     Recent Labs  Lab 09/07/14 1826  AMMONIA 31   CBC:  Recent Labs Lab 09/07/14 1547  09/08/14 1214 09/09/14 0344 09/11/14 0525  09/12/14 0355 09/13/14 0435  WBC 7.1  < > 6.5 5.6 7.8 8.0 5.8  NEUTROABS 4.6  --   --   --   --   --   --   HGB 13.3  < > 12.4* 12.7* 12.1* 12.9* 10.3*  HCT 36.6*  < > 34.8* 35.3* 33.9* 36.4* 29.0*  MCV 88.8  < > 89.5 90.3 89.4 91.0 92.4  PLT 91*  < > 40* 64* 84* 117* 126*  < > = values in this interval not displayed.    Signed:  Vassie Loll  [facility] 09/13/2014, 1:23 PM

## 2014-09-14 LAB — CULTURE, BLOOD (ROUTINE X 2)
CULTURE: NO GROWTH
Culture: NO GROWTH

## 2014-09-14 LAB — BODY FLUID CULTURE: Culture: NO GROWTH

## 2014-09-14 LAB — HEPATITIS B E ANTIGEN: Hep B E Ag: REACTIVE — AB

## 2014-09-14 LAB — HEPATITIS B E ANTIBODY: HEP B E AB: NONREACTIVE

## 2014-09-16 ENCOUNTER — Inpatient Hospital Stay: Payer: Self-pay | Admitting: Family Medicine

## 2014-09-20 ENCOUNTER — Telehealth: Payer: Self-pay | Admitting: General Practice

## 2014-09-20 ENCOUNTER — Ambulatory Visit: Payer: Medicaid Other | Attending: Family Medicine | Admitting: Physician Assistant

## 2014-09-20 VITALS — BP 124/72 | HR 82 | Temp 97.5°F | Resp 18 | Ht 72.0 in | Wt 162.0 lb

## 2014-09-20 DIAGNOSIS — K769 Liver disease, unspecified: Secondary | ICD-10-CM | POA: Diagnosis not present

## 2014-09-20 LAB — CBC WITH DIFFERENTIAL/PLATELET
BASOS ABS: 0.1 10*3/uL (ref 0.0–0.1)
BASOS PCT: 1 % (ref 0–1)
Eosinophils Absolute: 0.2 10*3/uL (ref 0.0–0.7)
Eosinophils Relative: 2 % (ref 0–5)
HCT: 34.6 % — ABNORMAL LOW (ref 39.0–52.0)
Hemoglobin: 11.9 g/dL — ABNORMAL LOW (ref 13.0–17.0)
LYMPHS PCT: 21 % (ref 12–46)
Lymphs Abs: 2.1 10*3/uL (ref 0.7–4.0)
MCH: 32.2 pg (ref 26.0–34.0)
MCHC: 34.4 g/dL (ref 30.0–36.0)
MCV: 93.8 fL (ref 78.0–100.0)
MONO ABS: 1 10*3/uL (ref 0.1–1.0)
MPV: 12.3 fL (ref 8.6–12.4)
Monocytes Relative: 10 % (ref 3–12)
NEUTROS ABS: 6.5 10*3/uL (ref 1.7–7.7)
NEUTROS PCT: 66 % (ref 43–77)
PLATELETS: 191 10*3/uL (ref 150–400)
RBC: 3.69 MIL/uL — AB (ref 4.22–5.81)
RDW: 15.6 % — AB (ref 11.5–15.5)
WBC: 9.8 10*3/uL (ref 4.0–10.5)

## 2014-09-20 LAB — CMP AND LIVER
ALBUMIN: 2.5 g/dL — AB (ref 3.5–5.2)
ALT: 76 U/L — ABNORMAL HIGH (ref 0–53)
AST: 131 U/L — ABNORMAL HIGH (ref 0–37)
Alkaline Phosphatase: 247 U/L — ABNORMAL HIGH (ref 39–117)
BILIRUBIN DIRECT: 0.6 mg/dL — AB (ref 0.0–0.3)
BILIRUBIN INDIRECT: 0.8 mg/dL (ref 0.2–1.2)
BUN: 6 mg/dL (ref 6–23)
CHLORIDE: 98 meq/L (ref 96–112)
CO2: 26 mEq/L (ref 19–32)
Calcium: 8.1 mg/dL — ABNORMAL LOW (ref 8.4–10.5)
Creat: 0.45 mg/dL — ABNORMAL LOW (ref 0.50–1.35)
GLUCOSE: 126 mg/dL — AB (ref 70–99)
Potassium: 3.8 mEq/L (ref 3.5–5.3)
Sodium: 132 mEq/L — ABNORMAL LOW (ref 135–145)
Total Bilirubin: 1.4 mg/dL — ABNORMAL HIGH (ref 0.2–1.2)
Total Protein: 6.8 g/dL (ref 6.0–8.3)

## 2014-09-20 LAB — POCT INR: INR: 1.2

## 2014-09-20 MED ORDER — OXYCODONE HCL 5 MG PO TABS: 5.0000 mg | ORAL_TABLET | Freq: Four times a day (QID) | ORAL | Status: DC | PRN

## 2014-09-20 NOTE — Progress Notes (Signed)
Patient gallbladder ruptured 2 weeks ago Wednesday and was hospitalized for 1 week.  Patient has newly diagnosed cirrhosis of liver and pancreatitis.  Patient states he cannot have gallbladder taken out until health improves.  Patient has GI appointment on 6/28.  Patient is visibly in pain and states that pain is constant 6/10 right now and closer to 9/10 at night when patient lies down.  Patient smokes 1/2 pack of cigarettes per day.
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
## 2014-09-20 NOTE — Telephone Encounter (Signed)
Pt requesting refill for ambien.  Also pt says that they found a GI specialist who is willing to see him sooner but they require referral.  Please f/u with pt.

## 2014-09-22 ENCOUNTER — Telehealth: Payer: Self-pay | Admitting: General Practice

## 2014-09-22 LAB — NGI HBV SUPERQUANT: Hepatitis B DNA: >170000000 IU/mL — ABNORMAL HIGH (ref <20)

## 2014-09-22 NOTE — Telephone Encounter (Signed)
Patient's daughter called to request blood work results for the patient. Please f/u

## 2014-09-24 ENCOUNTER — Other Ambulatory Visit: Payer: Self-pay | Admitting: Physician Assistant

## 2014-09-24 DIAGNOSIS — R740 Nonspecific elevation of levels of transaminase and lactic acid dehydrogenase [LDH]: Principal | ICD-10-CM

## 2014-09-24 DIAGNOSIS — R7401 Elevation of levels of liver transaminase levels: Secondary | ICD-10-CM

## 2014-09-26 ENCOUNTER — Telehealth: Payer: Self-pay | Admitting: *Deleted

## 2014-09-26 NOTE — Telephone Encounter (Signed)
Patient daughter called returning nurse's phone call. Please f/u

## 2014-09-26 NOTE — Telephone Encounter (Signed)
Patient's daughter called in to get results.  She states patient has an appointment with a GI MD on the 28th of June but that he cannot wait that long.  She has called a local [place] office in [place], [place] who is willing to see him once we fax his medical records-according to her patient has signed release form with them and they faxed us paperwork.  i told her I would investigate to see if I could find the fax and if not I would call the MD office myself and have them refax the paperwork to me.  She was very Adult nurseappreciative.

## 2014-09-26 NOTE — Telephone Encounter (Signed)
Spoke with [facility] regarding request for patient records.  The RN there said they were still processing the paperwork on their end and when they had that complete they would fax the request to us.  Spoke with patient's daughter and relayed that to her.  She said [facility].  She asked what she should do when her father ran out of his pain medication.  I explained that if patient did not have a PCP where they live she would need to take him to an urgent care of the emergency room.  She verbalized understanding.

## 2014-09-26 NOTE — Telephone Encounter (Signed)
Left HIPAA complaint message for patient to call back.

## 2014-09-26 NOTE — Telephone Encounter (Signed)
-----   Message from Vivianne Masteriffany S Noel, [place] sent at 09/24/2014  2:02 PM EDT ----- Benjaman LobeHey Kimoni Pagliarulo,  We saw this guy last week and I see the messages from staff. Will you call him and let him or his daughter know that the labs are stable? No worse but no significant improvement either. Also let them know that the referral was placed to GI. Thanks!!  ----- Message -----    From: Manfred ArchMuhammad W Khuwaja    Sent: 09/20/2014  12:48 PM      To: Vivianne Masteriffany S Noel, PA-C

## 2014-10-11 ENCOUNTER — Ambulatory Visit: Payer: Self-pay | Admitting: Family Medicine

## 2014-10-18 ENCOUNTER — Ambulatory Visit: Payer: Self-pay | Admitting: Internal Medicine

## 2014-10-18 ENCOUNTER — Ambulatory Visit: Payer: Self-pay | Admitting: Physician Assistant

## 2015-06-28 ENCOUNTER — Inpatient Hospital Stay (HOSPITAL_COMMUNITY)
Admission: EM | Admit: 2015-06-28 | Discharge: 2015-06-29 | DRG: 433 | Disposition: A | Discharge status: Home / Self Care | Payer: Medicaid Other | Attending: Internal Medicine | Admitting: Internal Medicine

## 2015-06-28 ENCOUNTER — Encounter (HOSPITAL_COMMUNITY): Payer: Self-pay | Admitting: Emergency Medicine

## 2015-06-28 ENCOUNTER — Emergency Department (HOSPITAL_COMMUNITY): Payer: Medicaid Other

## 2015-06-28 DIAGNOSIS — F1020 Alcohol dependence, uncomplicated: Secondary | ICD-10-CM | POA: Diagnosis present

## 2015-06-28 DIAGNOSIS — F1290 Cannabis use, unspecified, uncomplicated: Secondary | ICD-10-CM | POA: Diagnosis present

## 2015-06-28 DIAGNOSIS — F17210 Nicotine dependence, cigarettes, uncomplicated: Secondary | ICD-10-CM | POA: Diagnosis present

## 2015-06-28 DIAGNOSIS — K7290 Hepatic failure, unspecified without coma: Secondary | ICD-10-CM | POA: Diagnosis present

## 2015-06-28 DIAGNOSIS — Z79899 Other long term (current) drug therapy: Secondary | ICD-10-CM

## 2015-06-28 DIAGNOSIS — D696 Thrombocytopenia, unspecified: Secondary | ICD-10-CM | POA: Diagnosis present

## 2015-06-28 DIAGNOSIS — Z79891 Long term (current) use of opiate analgesic: Secondary | ICD-10-CM

## 2015-06-28 DIAGNOSIS — R188 Other ascites: Secondary | ICD-10-CM

## 2015-06-28 DIAGNOSIS — Z825 Family history of asthma and other chronic lower respiratory diseases: Secondary | ICD-10-CM

## 2015-06-28 DIAGNOSIS — R079 Chest pain, unspecified: Secondary | ICD-10-CM

## 2015-06-28 DIAGNOSIS — K7031 Alcoholic cirrhosis of liver with ascites: Principal | ICD-10-CM | POA: Diagnosis present

## 2015-06-28 DIAGNOSIS — D689 Coagulation defect, unspecified: Secondary | ICD-10-CM | POA: Diagnosis present

## 2015-06-28 DIAGNOSIS — R109 Unspecified abdominal pain: Secondary | ICD-10-CM

## 2015-06-28 DIAGNOSIS — R0602 Shortness of breath: Secondary | ICD-10-CM | POA: Diagnosis present

## 2015-06-28 DIAGNOSIS — K4090 Unilateral inguinal hernia, without obstruction or gangrene, not specified as recurrent: Secondary | ICD-10-CM | POA: Diagnosis present

## 2015-06-28 DIAGNOSIS — K7460 Unspecified cirrhosis of liver: Secondary | ICD-10-CM | POA: Diagnosis present

## 2015-06-28 DIAGNOSIS — Z809 Family history of malignant neoplasm, unspecified: Secondary | ICD-10-CM

## 2015-06-28 LAB — BASIC METABOLIC PANEL
ANION GAP: 5 (ref 5–15)
BUN: 5 mg/dL — ABNORMAL LOW (ref 6–20)
CO2: 27 mmol/L (ref 22–32)
Calcium: 8.4 mg/dL — ABNORMAL LOW (ref 8.9–10.3)
Chloride: 106 mmol/L (ref 101–111)
Creatinine, Ser: 0.63 mg/dL (ref 0.61–1.24)
GFR calc non Af Amer: >60 mL/min (ref >60)
Glucose, Bld: 102 mg/dL — ABNORMAL HIGH (ref 65–99)
POTASSIUM: 4.5 mmol/L (ref 3.5–5.1)
Sodium: 138 mmol/L (ref 135–145)

## 2015-06-28 LAB — CBC
HEMATOCRIT: 38.6 % — AB (ref 39.0–52.0)
HEMOGLOBIN: 13.6 g/dL (ref 13.0–17.0)
MCH: 33 pg (ref 26.0–34.0)
MCHC: 35.2 g/dL (ref 30.0–36.0)
MCV: 93.7 fL (ref 78.0–100.0)
Platelets: 102 10*3/uL — ABNORMAL LOW (ref 150–400)
RBC: 4.12 MIL/uL — AB (ref 4.22–5.81)
RDW: 16.4 % — ABNORMAL HIGH (ref 11.5–15.5)
WBC: 5.6 10*3/uL (ref 4.0–10.5)

## 2015-06-28 LAB — I-STAT TROPONIN, ED: Troponin i, poc: 0 ng/mL (ref 0.00–0.08)

## 2015-06-28 MED ORDER — FENTANYL CITRATE (PF) 100 MCG/2ML IJ SOLN
50.0000 ug | Freq: Once | INTRAMUSCULAR | Status: AC
  Administered 2015-06-28: 50 ug via INTRAVENOUS
  Filled 2015-06-28: qty 2

## 2015-06-28 MED ORDER — OXYCODONE-ACETAMINOPHEN 5-325 MG PO TABS
ORAL_TABLET | ORAL | Status: AC
  Filled 2015-06-28: qty 1

## 2015-06-28 MED ORDER — OXYCODONE-ACETAMINOPHEN 5-325 MG PO TABS: 1.0000 | ORAL_TABLET | Freq: Once | ORAL | Status: DC

## 2015-06-28 NOTE — ED Notes (Signed)
Patient getting dressed. EMT present for assistance

## 2015-06-28 NOTE — ED Notes (Signed)
Dr. Pollina at the bedside.  
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
## 2015-06-28 NOTE — ED Notes (Signed)
Ina HomesJesse Megill, daughter, [phone] if needed.

## 2015-06-28 NOTE — ED Notes (Signed)
Pt c/o cp and sob d/t fluid retention. Pt with hx of cirrhosis and having to have fluid drained off. Also told last year that he needed to have gallbladder removed. Pt also c.o worsening L inguinal hernia.

## 2015-06-29 ENCOUNTER — Emergency Department (HOSPITAL_COMMUNITY): Payer: Medicaid Other

## 2015-06-29 ENCOUNTER — Encounter (HOSPITAL_COMMUNITY): Payer: Self-pay | Admitting: Radiology

## 2015-06-29 ENCOUNTER — Inpatient Hospital Stay (HOSPITAL_COMMUNITY): Payer: Medicaid Other

## 2015-06-29 DIAGNOSIS — K4090 Unilateral inguinal hernia, without obstruction or gangrene, not specified as recurrent: Secondary | ICD-10-CM

## 2015-06-29 DIAGNOSIS — R0602 Shortness of breath: Secondary | ICD-10-CM | POA: Diagnosis present

## 2015-06-29 DIAGNOSIS — D696 Thrombocytopenia, unspecified: Secondary | ICD-10-CM | POA: Diagnosis present

## 2015-06-29 DIAGNOSIS — K7460 Unspecified cirrhosis of liver: Secondary | ICD-10-CM | POA: Diagnosis present

## 2015-06-29 DIAGNOSIS — Z825 Family history of asthma and other chronic lower respiratory diseases: Secondary | ICD-10-CM | POA: Diagnosis not present

## 2015-06-29 DIAGNOSIS — F1020 Alcohol dependence, uncomplicated: Secondary | ICD-10-CM | POA: Diagnosis present

## 2015-06-29 DIAGNOSIS — R188 Other ascites: Secondary | ICD-10-CM | POA: Diagnosis not present

## 2015-06-29 DIAGNOSIS — Z79899 Other long term (current) drug therapy: Secondary | ICD-10-CM | POA: Diagnosis not present

## 2015-06-29 DIAGNOSIS — Z79891 Long term (current) use of opiate analgesic: Secondary | ICD-10-CM | POA: Diagnosis not present

## 2015-06-29 DIAGNOSIS — F17210 Nicotine dependence, cigarettes, uncomplicated: Secondary | ICD-10-CM | POA: Diagnosis present

## 2015-06-29 DIAGNOSIS — R079 Chest pain, unspecified: Secondary | ICD-10-CM | POA: Insufficient documentation

## 2015-06-29 DIAGNOSIS — K7290 Hepatic failure, unspecified without coma: Secondary | ICD-10-CM | POA: Diagnosis not present

## 2015-06-29 DIAGNOSIS — F1290 Cannabis use, unspecified, uncomplicated: Secondary | ICD-10-CM | POA: Diagnosis present

## 2015-06-29 DIAGNOSIS — Z809 Family history of malignant neoplasm, unspecified: Secondary | ICD-10-CM | POA: Diagnosis not present

## 2015-06-29 DIAGNOSIS — K7031 Alcoholic cirrhosis of liver with ascites: Secondary | ICD-10-CM | POA: Diagnosis present

## 2015-06-29 DIAGNOSIS — D689 Coagulation defect, unspecified: Secondary | ICD-10-CM | POA: Diagnosis present

## 2015-06-29 LAB — TROPONIN I
Troponin I: 0.26 ng/mL — ABNORMAL HIGH (ref <0.031)
Troponin I: 0.19 ng/mL — ABNORMAL HIGH (ref <0.031)

## 2015-06-29 LAB — MAGNESIUM: Magnesium: 1.4 mg/dL — ABNORMAL LOW (ref 1.7–2.4)

## 2015-06-29 LAB — CBC WITH DIFFERENTIAL/PLATELET
Basophils Absolute: 0 10*3/uL (ref 0.0–0.1)
Basophils Relative: 0 %
EOS ABS: 0.3 10*3/uL (ref 0.0–0.7)
Eosinophils Relative: 8 %
HEMATOCRIT: 37.9 % — AB (ref 39.0–52.0)
HEMOGLOBIN: 13.4 g/dL (ref 13.0–17.0)
LYMPHS ABS: 1.7 10*3/uL (ref 0.7–4.0)
Lymphocytes Relative: 46 %
MCH: 32.8 pg (ref 26.0–34.0)
MCHC: 35.4 g/dL (ref 30.0–36.0)
MCV: 92.9 fL (ref 78.0–100.0)
MONOS PCT: 9 %
Monocytes Absolute: 0.3 10*3/uL (ref 0.1–1.0)
NEUTROS ABS: 1.4 10*3/uL — AB (ref 1.7–7.7)
NEUTROS PCT: 37 %
Platelets: 84 10*3/uL — ABNORMAL LOW (ref 150–400)
RBC: 4.08 MIL/uL — AB (ref 4.22–5.81)
RDW: 16.4 % — ABNORMAL HIGH (ref 11.5–15.5)
WBC: 3.6 10*3/uL — AB (ref 4.0–10.5)

## 2015-06-29 LAB — COMPREHENSIVE METABOLIC PANEL
ALK PHOS: 237 U/L — AB (ref 38–126)
ALT: 109 U/L — AB (ref 17–63)
ANION GAP: 8 (ref 5–15)
AST: 387 U/L — ABNORMAL HIGH (ref 15–41)
Albumin: 1.8 g/dL — ABNORMAL LOW (ref 3.5–5.0)
BUN: <5 mg/dL — ABNORMAL LOW (ref 6–20)
CALCIUM: 8 mg/dL — AB (ref 8.9–10.3)
CO2: 27 mmol/L (ref 22–32)
CREATININE: 0.55 mg/dL — AB (ref 0.61–1.24)
Chloride: 105 mmol/L (ref 101–111)
Glucose, Bld: 108 mg/dL — ABNORMAL HIGH (ref 65–99)
Potassium: 3.7 mmol/L (ref 3.5–5.1)
SODIUM: 140 mmol/L (ref 135–145)
Total Bilirubin: 2.5 mg/dL — ABNORMAL HIGH (ref 0.3–1.2)
Total Protein: 6.4 g/dL — ABNORMAL LOW (ref 6.5–8.1)

## 2015-06-29 LAB — GRAM STAIN

## 2015-06-29 LAB — PROTIME-INR
INR: 1.76 — ABNORMAL HIGH (ref 0.00–1.49)
PROTHROMBIN TIME: 20.5 s — AB (ref 11.6–15.2)

## 2015-06-29 LAB — HEPATIC FUNCTION PANEL
ALBUMIN: 1.8 g/dL — AB (ref 3.5–5.0)
ALT: 110 U/L — AB (ref 17–63)
AST: 384 U/L — ABNORMAL HIGH (ref 15–41)
Alkaline Phosphatase: 246 U/L — ABNORMAL HIGH (ref 38–126)
BILIRUBIN INDIRECT: 1.5 mg/dL — AB (ref 0.3–0.9)
Bilirubin, Direct: 1.3 mg/dL — ABNORMAL HIGH (ref 0.1–0.5)
TOTAL PROTEIN: 6.8 g/dL (ref 6.5–8.1)
Total Bilirubin: 2.8 mg/dL — ABNORMAL HIGH (ref 0.3–1.2)

## 2015-06-29 LAB — LACTATE DEHYDROGENASE, PLEURAL OR PERITONEAL FLUID: LD FL: 34 U/L — AB (ref 3–23)

## 2015-06-29 LAB — PROTEIN, BODY FLUID: Total protein, fluid: <3 g/dL

## 2015-06-29 LAB — ALBUMIN, FLUID (OTHER): Albumin, Fluid: <1 g/dL

## 2015-06-29 LAB — AMMONIA: AMMONIA: 50 umol/L — AB (ref 9–35)

## 2015-06-29 LAB — GLUCOSE, PERITONEAL FLUID: Glucose, Peritoneal Fluid: 96 mg/dL

## 2015-06-29 LAB — LIPASE, BLOOD: LIPASE: 47 U/L (ref 11–51)

## 2015-06-29 MED ORDER — FUROSEMIDE 20 MG PO TABS
20.0000 mg | ORAL_TABLET | Freq: Every day | ORAL | Status: AC
Start: 2015-06-29 — End: ?

## 2015-06-29 MED ORDER — SPIRONOLACTONE 50 MG PO TABS
50.0000 mg | ORAL_TABLET | Freq: Every day | ORAL | Status: DC
  Administered 2015-06-29: 50 mg via ORAL
  Filled 2015-06-29: qty 1

## 2015-06-29 MED ORDER — FUROSEMIDE 10 MG/ML IJ SOLN
40.0000 mg | Freq: Once | INTRAMUSCULAR | Status: AC
  Administered 2015-06-29: 40 mg via INTRAVENOUS
  Filled 2015-06-29: qty 4

## 2015-06-29 MED ORDER — SPIRONOLACTONE 50 MG PO TABS
50.0000 mg | ORAL_TABLET | Freq: Every day | ORAL | Status: AC
Start: 2015-06-29 — End: ?

## 2015-06-29 MED ORDER — PANTOPRAZOLE SODIUM 40 MG PO TBEC
40.0000 mg | DELAYED_RELEASE_TABLET | Freq: Every day | ORAL | Status: DC
  Administered 2015-06-29: 40 mg via ORAL
  Filled 2015-06-29: qty 1

## 2015-06-29 MED ORDER — ONDANSETRON HCL 4 MG PO TABS: 4.0000 mg | ORAL_TABLET | Freq: Four times a day (QID) | ORAL | Status: DC | PRN

## 2015-06-29 MED ORDER — FUROSEMIDE 20 MG PO TABS
20.0000 mg | ORAL_TABLET | Freq: Every day | ORAL | Status: DC
  Administered 2015-06-29: 20 mg via ORAL
  Filled 2015-06-29: qty 1

## 2015-06-29 MED ORDER — IOHEXOL 300 MG/ML  SOLN
100.0000 mL | Freq: Once | INTRAMUSCULAR | Status: AC | PRN
  Administered 2015-06-29: 100 mL via INTRAVENOUS

## 2015-06-29 MED ORDER — DEXTROSE 5 % IV SOLN: 1.0000 g | INTRAVENOUS | Status: DC

## 2015-06-29 MED ORDER — ONDANSETRON HCL 4 MG/2ML IJ SOLN
4.0000 mg | Freq: Once | INTRAMUSCULAR | Status: AC
  Administered 2015-06-29: 4 mg via INTRAVENOUS
  Filled 2015-06-29: qty 2

## 2015-06-29 MED ORDER — MORPHINE SULFATE (PF) 2 MG/ML IV SOLN
1.0000 mg | INTRAVENOUS | Status: DC | PRN
  Administered 2015-06-29 (×2): 1 mg via INTRAVENOUS
  Filled 2015-06-29 (×2): qty 1

## 2015-06-29 MED ORDER — ONDANSETRON HCL 4 MG/2ML IJ SOLN: 4.0000 mg | Freq: Four times a day (QID) | INTRAMUSCULAR | Status: DC | PRN

## 2015-06-29 MED ORDER — HYDROMORPHONE HCL 1 MG/ML IJ SOLN
1.0000 mg | Freq: Once | INTRAMUSCULAR | Status: AC
  Administered 2015-06-29: 1 mg via INTRAVENOUS
  Filled 2015-06-29: qty 1

## 2015-06-29 MED ORDER — DEXTROSE 5 % IV SOLN
1.0000 g | Freq: Once | INTRAVENOUS | Status: AC
  Administered 2015-06-29: 1 g via INTRAVENOUS
  Filled 2015-06-29: qty 10

## 2015-06-29 NOTE — ED Notes (Signed)
MD at the bedside  

## 2015-06-29 NOTE — ED Notes (Signed)
Called dr. Blinda LeatherwoodPollina in regards to patient's pain. MD ackowledges, awaiting new orders.

## 2015-06-29 NOTE — ED Notes (Signed)
atempted to call daughter, no answer. Patient is currently awake, alert, and oriented.
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
## 2015-06-29 NOTE — ED Notes (Signed)
Patient transported to Ultrasound 

## 2015-06-29 NOTE — ED Notes (Signed)
Breakfast tray ordered, 2gram soduim

## 2015-06-29 NOTE — ED Notes (Signed)
Dr. Kakrakandy, hospitalist, at the bedside. 

## 2015-06-29 NOTE — ED Notes (Signed)
Pt had all belongings at time of d/c. 

## 2015-06-29 NOTE — ED Notes (Signed)
Patient transorted to CT with thomasina, transporter.
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
## 2015-06-30 LAB — HEPATITIS PANEL, ACUTE
HCV Ab: 0.2 s/co ratio (ref 0.0–0.9)
HEP A IGM: NEGATIVE
HEP B C IGM: UNDETERMINED
Hepatitis B Surface Ag: POSITIVE — AB

## 2015-07-01 NOTE — ED Notes (Signed)
Wasted 1mg  of dilaudid with Andres EgeMatthew Beasley RN

## 2015-07-01 NOTE — ED Notes (Signed)
Wasted 1mg  of morphine with Randie HeinzElizabeth Poulous RN

## 2015-07-02 ENCOUNTER — Telehealth: Payer: Self-pay | Admitting: *Deleted

## 2015-07-03 ENCOUNTER — Telehealth (HOSPITAL_BASED_OUTPATIENT_CLINIC_OR_DEPARTMENT_OTHER): Payer: Self-pay | Admitting: Emergency Medicine

## 2015-07-03 NOTE — Telephone Encounter (Signed)
Pt with + HEP B surface Antigen, per Dr. Ethelda ChickJacubowitz, call pt to notify and ensure followup with PCP, left voicenail, [facility]

## 2015-07-03 NOTE — Telephone Encounter (Signed)
Post ED Visit - Positive Culture Follow-up: Successful Patient Follow-Up  Culture assessed and recommendations reviewed by: []  Enzo BiNathan Batchelder, Pharm.D. []  Celedonio MiyamotoJeremy Frens, Pharm.D., BCPS []  Garvin FilaMike Maccia, Pharm.D. []  Georgina PillionElizabeth Martin, Pharm.D., BCPS []  [place]agletownMinh Pham, [address]Pharm[address]D., BCPS, AAHIVP []  Estella HuskMichelle Turner, Pharm.D., BCPS, AAHIVP []  Tennis Mustassie Stewart, Pharm.D. []  Sherle Poeob Vincent, [address]Pharm[address]  Positive hepatitis B surface antigen  []  Patient discharged without antimicrobial prescription and treatment is now indicated []  Organism is resistant to prescribed ED discharge antimicrobial []  Patient with positive blood cultures  Changes discussed with ED provider: Dr. Ethelda ChickJacubowitz Attempting to notify pt of results and to followup with PCP  Lvm, letter sent   Berle MullMiller, Janat Tabbert 07/03/2015, 4:41 PM

## 2015-07-04 LAB — CULTURE, BODY FLUID W GRAM STAIN -BOTTLE: Culture: NO GROWTH

## 2015-07-04 NOTE — Telephone Encounter (Signed)
Pt stated that he was referred to Dr. Lyn HollingsheadAlexander from the ER.  [place] [address] [place]  He would prefer to see MD closer to where he lives.  Pt stated that he had fluid draining from area where they put a drain in.  I explained that if increased pain or drainage continues he needs to go to ER.

## 2016-11-20 IMAGING — US US PARACENTESIS
1 series · 6 of 6 positions shown · non-contrast
Comparison: none

INDICATION: 44-year-old male with ascites

[Series 1: us paracentesis · 0.26mm/px · 6 of 6 slices shown]
[im 1/6]
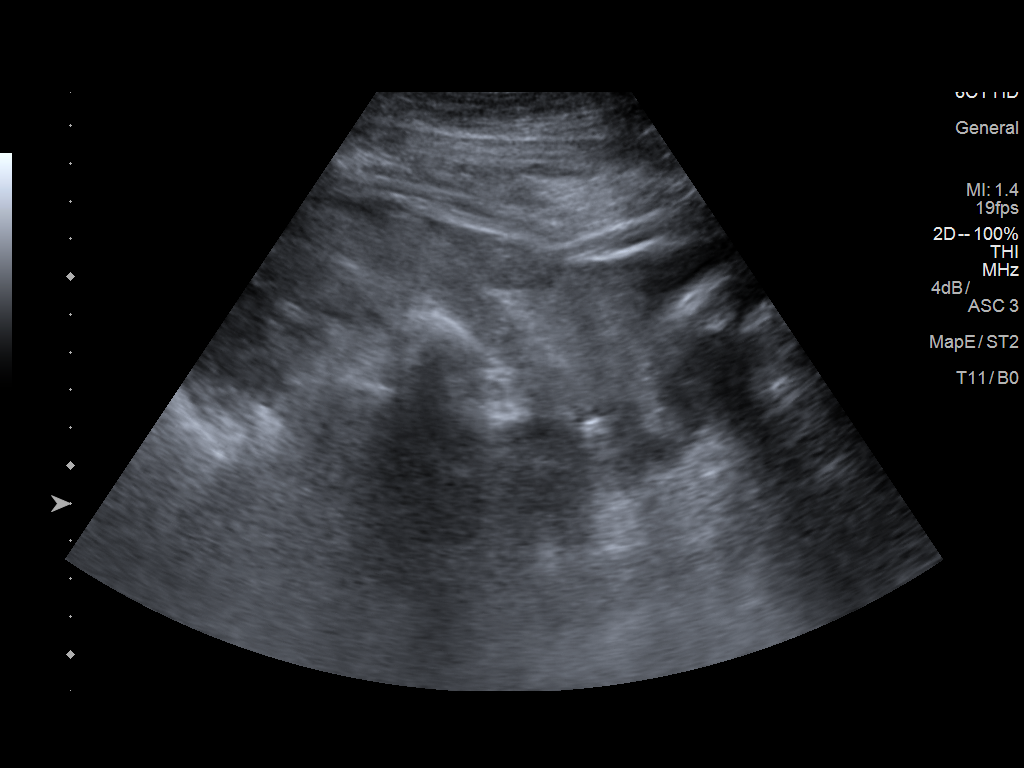
[im 2/6]
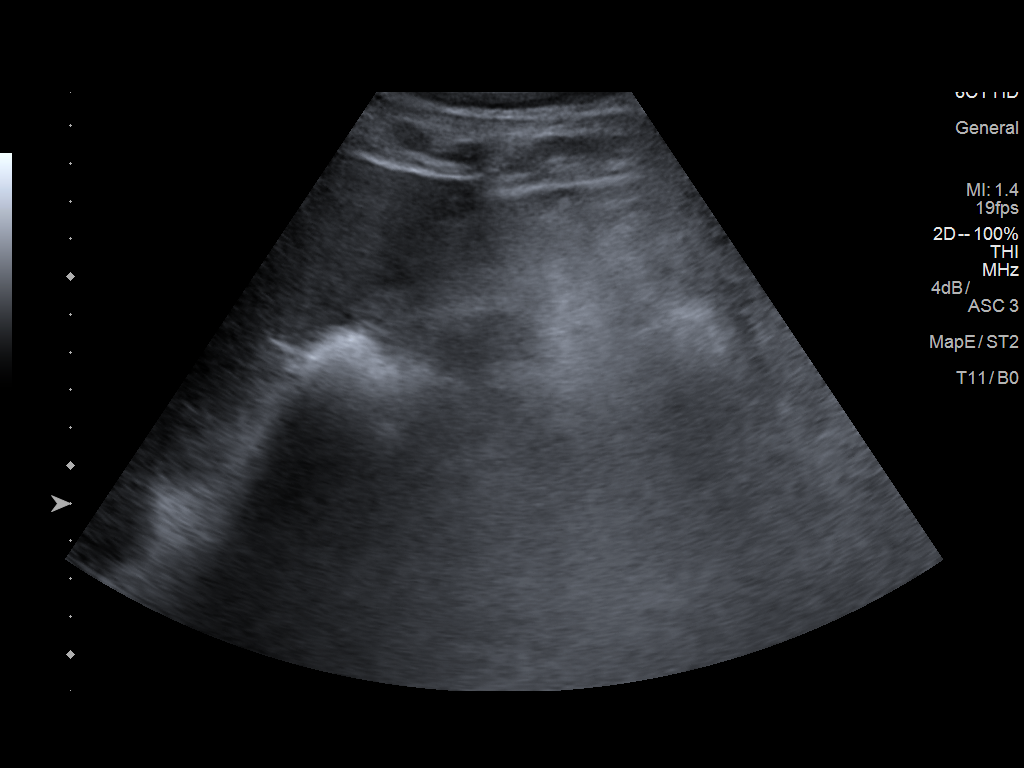
[im 3/6]
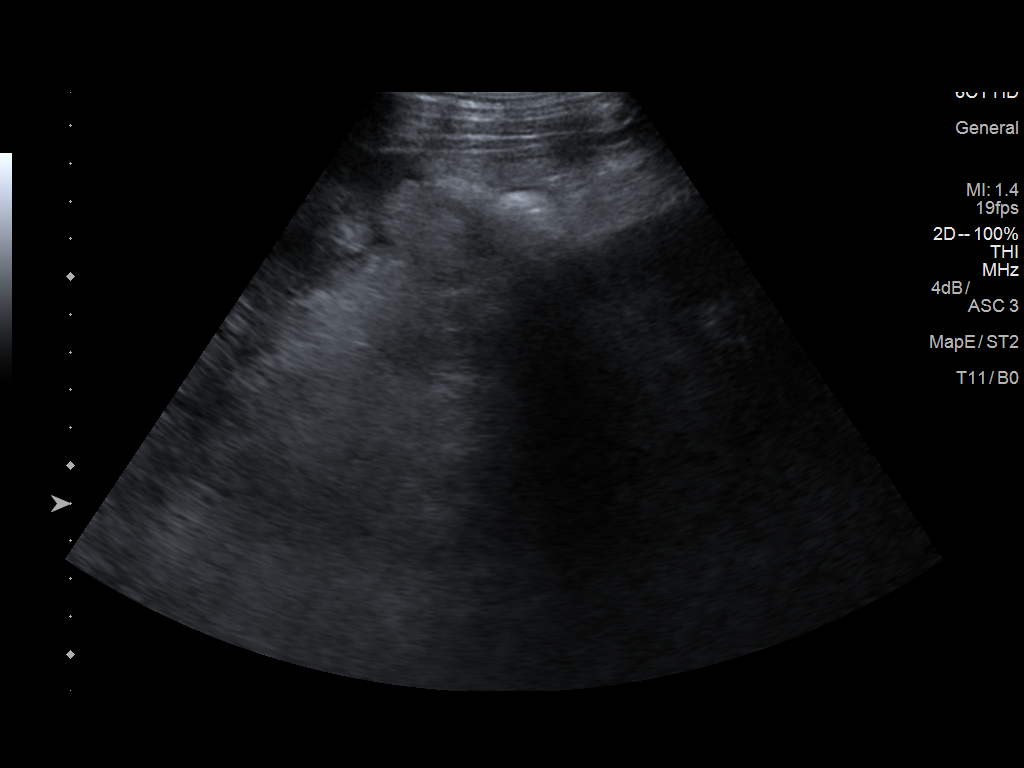
[im 4/6]
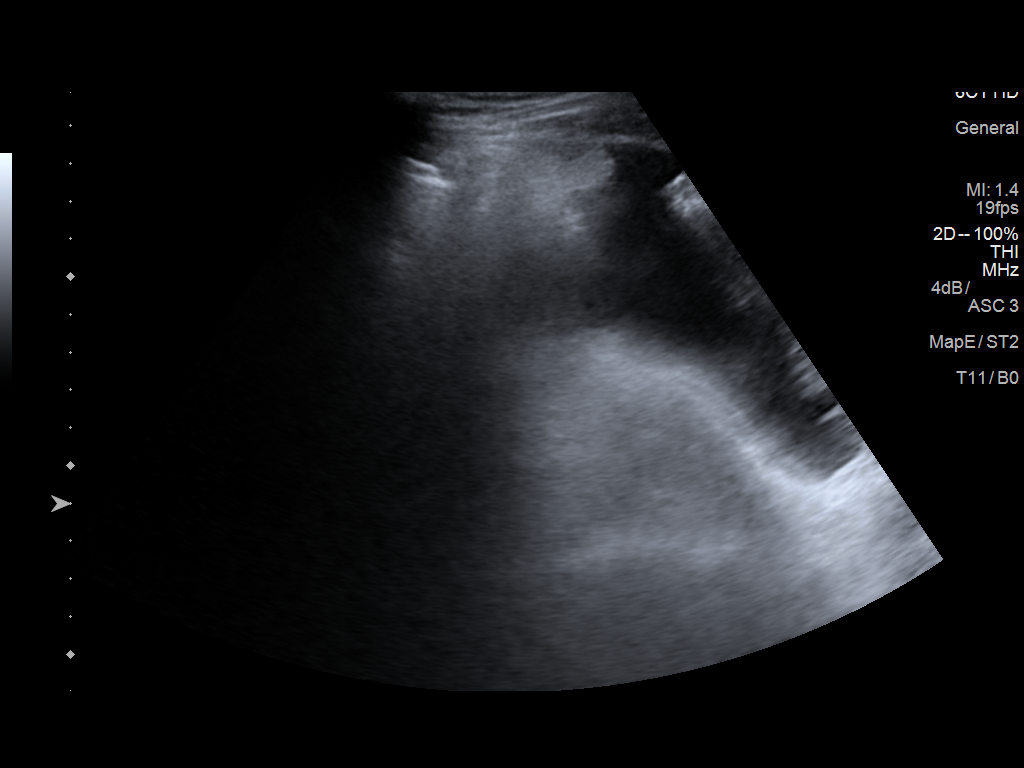
[im 5/6]
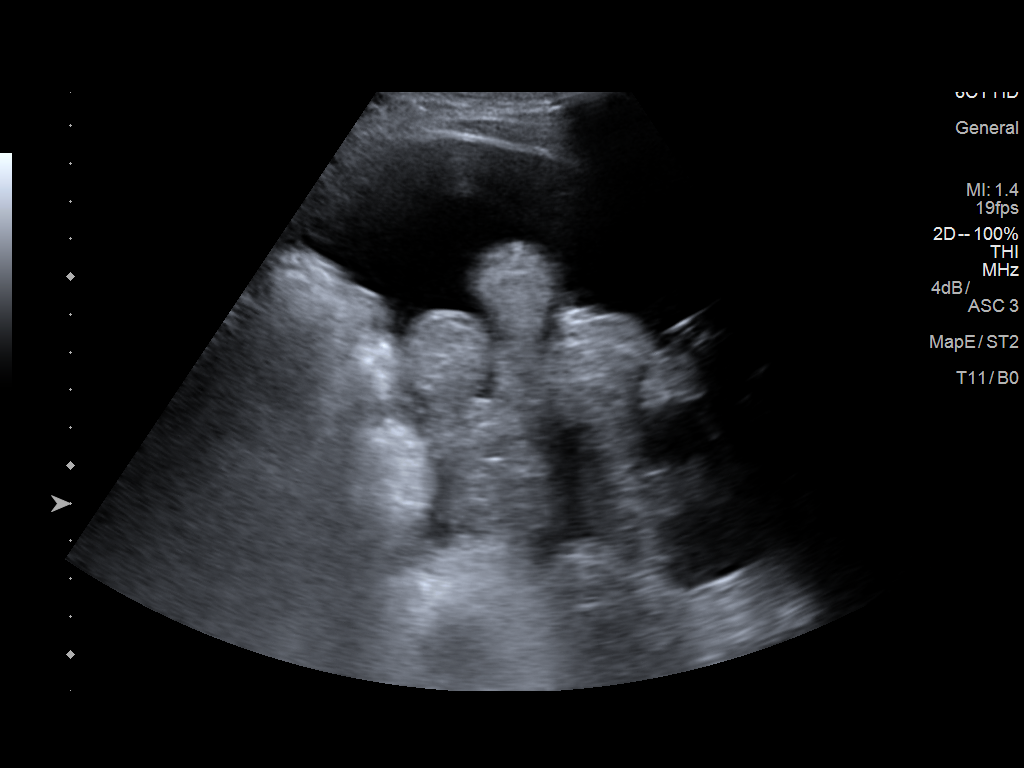
[im 6/6]
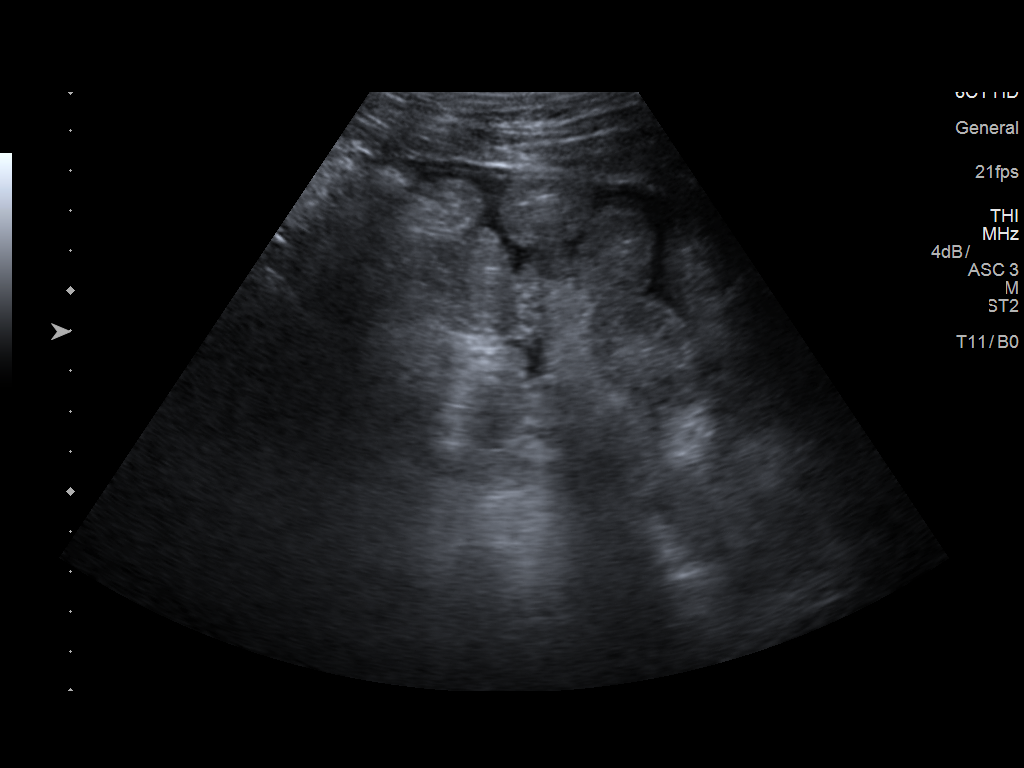

[6 of 6 positions shown; findings below may reference images not displayed]

EXAM:
ULTRASOUND GUIDED  PARACENTESIS

MEDICATIONS:
None.

COMPLICATIONS:
None immediate.

PROCEDURE:
Informed written consent was obtained from the patient after a
discussion of the risks, benefits and alternatives to treatment. A
timeout was performed prior to the initiation of the procedure.

Initial ultrasound scanning demonstrates a large amount of ascites
within the left lower abdominal quadrant. The right lower abdomen
was prepped and draped in the usual sterile fashion. 1% lidocaine
with epinephrine was used for local anesthesia.

Following this, a 19 gauge, 7-cm, Yueh catheter was introduced. An
ultrasound image was saved for documentation purposes. The
paracentesis was performed. The catheter was removed and a dressing
was applied. The patient tolerated the procedure well without
immediate post procedural complication.
FINDINGS: A total of approximately 1.4 L of ascitic fluid was removed. Samples
were sent to the laboratory as requested by the clinical team.
IMPRESSION: Successful ultrasound-guided paracentesis yielding 1.4 liters of
peritoneal fluid.
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
## 2020-08-20 DEATH — deceased
